# Patient Record
Sex: Male | Born: 1990 | Race: Black or African American | Hispanic: No | Marital: Single | Smoking: Current every day smoker
Health system: Southern US, Community
[De-identification: ages and names within clinical notes are randomized; demographics above are authoritative.]

## PROBLEM LIST (undated history)

## (undated) DIAGNOSIS — Z201 Contact with and (suspected) exposure to tuberculosis: Secondary | ICD-10-CM

## (undated) DIAGNOSIS — R569 Unspecified convulsions: Secondary | ICD-10-CM

## (undated) DIAGNOSIS — E119 Type 2 diabetes mellitus without complications: Secondary | ICD-10-CM

## (undated) HISTORY — PX: HERNIA REPAIR: SHX51

---
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
## 2010-10-24 ENCOUNTER — Emergency Department (HOSPITAL_COMMUNITY): Payer: Self-pay

## 2010-10-24 ENCOUNTER — Emergency Department (HOSPITAL_COMMUNITY)
Admission: EM | Admit: 2010-10-24 | Discharge: 2010-10-25 | Disposition: A | Discharge status: Home / Self Care | Payer: No Typology Code available for payment source | Attending: Emergency Medicine | Admitting: Emergency Medicine

## 2010-10-24 DIAGNOSIS — R6884 Jaw pain: Secondary | ICD-10-CM | POA: Insufficient documentation

## 2010-10-24 DIAGNOSIS — R569 Unspecified convulsions: Secondary | ICD-10-CM | POA: Insufficient documentation

## 2010-10-24 DIAGNOSIS — R32 Unspecified urinary incontinence: Secondary | ICD-10-CM | POA: Insufficient documentation

## 2010-10-24 DIAGNOSIS — R05 Cough: Secondary | ICD-10-CM | POA: Insufficient documentation

## 2010-10-24 DIAGNOSIS — R059 Cough, unspecified: Secondary | ICD-10-CM | POA: Insufficient documentation

## 2010-10-24 DIAGNOSIS — F29 Unspecified psychosis not due to a substance or known physiological condition: Secondary | ICD-10-CM | POA: Insufficient documentation

## 2010-10-24 DIAGNOSIS — M79609 Pain in unspecified limb: Secondary | ICD-10-CM | POA: Insufficient documentation

## 2010-10-24 DIAGNOSIS — M62838 Other muscle spasm: Secondary | ICD-10-CM | POA: Insufficient documentation

## 2010-10-24 DIAGNOSIS — IMO0001 Reserved for inherently not codable concepts without codable children: Secondary | ICD-10-CM | POA: Insufficient documentation

## 2010-10-24 DIAGNOSIS — R112 Nausea with vomiting, unspecified: Secondary | ICD-10-CM | POA: Insufficient documentation

## 2010-10-24 LAB — POCT I-STAT, CHEM 8
Calcium, Ion: 1.13 mmol/L (ref 1.12–1.32)
Creatinine, Ser: 1.3 mg/dL (ref 0.4–1.5)
Glucose, Bld: 89 mg/dL (ref 70–99)
Hemoglobin: 17 g/dL (ref 13.0–17.0)
Sodium: 140 mEq/L (ref 135–145)
TCO2: 29 mmol/L (ref 0–100)

## 2011-04-16 ENCOUNTER — Emergency Department (HOSPITAL_COMMUNITY)
Admission: EM | Admit: 2011-04-16 | Discharge: 2011-04-16 | Disposition: A | Discharge status: Home / Self Care | Payer: Self-pay | Attending: General Surgery | Admitting: General Surgery

## 2011-04-16 ENCOUNTER — Emergency Department (HOSPITAL_COMMUNITY): Payer: Self-pay

## 2011-04-16 DIAGNOSIS — S21109A Unspecified open wound of unspecified front wall of thorax without penetration into thoracic cavity, initial encounter: Secondary | ICD-10-CM | POA: Insufficient documentation

## 2011-04-16 DIAGNOSIS — W3400XA Accidental discharge from unspecified firearms or gun, initial encounter: Secondary | ICD-10-CM | POA: Insufficient documentation

## 2011-04-16 DIAGNOSIS — IMO0002 Reserved for concepts with insufficient information to code with codable children: Secondary | ICD-10-CM | POA: Insufficient documentation

## 2011-04-16 LAB — TYPE AND SCREEN

## 2011-08-13 ENCOUNTER — Ambulatory Visit (INDEPENDENT_AMBULATORY_CARE_PROVIDER_SITE_OTHER): Payer: Self-pay | Admitting: General Surgery

## 2011-08-19 ENCOUNTER — Encounter (INDEPENDENT_AMBULATORY_CARE_PROVIDER_SITE_OTHER): Payer: Self-pay | Admitting: General Surgery

## 2012-06-21 ENCOUNTER — Emergency Department (HOSPITAL_COMMUNITY)
Admission: EM | Admit: 2012-06-21 | Discharge: 2012-06-22 | Disposition: A | Discharge status: Home / Self Care | Attending: Emergency Medicine | Admitting: Emergency Medicine

## 2012-06-21 ENCOUNTER — Other Ambulatory Visit: Payer: Self-pay

## 2012-06-21 ENCOUNTER — Encounter (HOSPITAL_COMMUNITY): Payer: Self-pay | Admitting: Emergency Medicine

## 2012-06-21 DIAGNOSIS — Z79899 Other long term (current) drug therapy: Secondary | ICD-10-CM | POA: Insufficient documentation

## 2012-06-21 DIAGNOSIS — Z201 Contact with and (suspected) exposure to tuberculosis: Secondary | ICD-10-CM | POA: Insufficient documentation

## 2012-06-21 DIAGNOSIS — R000 Tachycardia, unspecified: Secondary | ICD-10-CM | POA: Insufficient documentation

## 2012-06-21 DIAGNOSIS — H9209 Otalgia, unspecified ear: Secondary | ICD-10-CM | POA: Insufficient documentation

## 2012-06-21 DIAGNOSIS — Z8669 Personal history of other diseases of the nervous system and sense organs: Secondary | ICD-10-CM | POA: Insufficient documentation

## 2012-06-21 DIAGNOSIS — R42 Dizziness and giddiness: Secondary | ICD-10-CM

## 2012-06-21 DIAGNOSIS — R51 Headache: Secondary | ICD-10-CM | POA: Insufficient documentation

## 2012-06-21 HISTORY — DX: Unspecified convulsions: R56.9

## 2012-06-21 HISTORY — DX: Contact with and (suspected) exposure to tuberculosis: Z20.1

## 2012-06-21 NOTE — ED Notes (Signed)
Patient complaining of fast heart rate x 2-3 days with dizziness starting today. Also complaining of neck pain.

## 2012-06-21 NOTE — ED Notes (Addendum)
Pt c/o dizziness and tachycardia.  States that he can "feel his heart beating".  He states that it feels "normal" when lying down but when he stands the heart rate does not change but he can feel it "pounding".  Denies feeling this way at this time.  Heart rate has been within normal limits while presenting at the ED.  Also c/o left sided neck pain extending down into his shoulder. Denies any strain.  Nad.

## 2012-06-21 NOTE — ED Notes (Addendum)
Patient did not complain of being dizzy. Patient did state that he felt like his heart was beating harder when standing. NAD. No needs voiced at this time.

## 2012-06-21 NOTE — ED Provider Notes (Signed)
History     CSN: [id number]  Arrival date & time 06/21/12  2246   First MD Initiated Contact with Patient 06/21/12 2333      Chief Complaint  Patient presents with  . Dizziness  . Tachycardia    (Consider location/radiation/quality/duration/timing/severity/associated sxs/prior treatment) HPI Comments: 21 year old male with a history of no significant medical problems presents with 2 or 3 days of feeling lightheaded when he stands up. He states that he has no other complaints including fevers, chills, nausea, vomiting, chest pain, back pain, abdominal pain, swelling, rashes, dysuria, diarrhea. 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  The symptoms are mild, intermittent, worse with standing, associated with a feeling of his heart beating. He denies that his heart is beating fast or irregular but states that he is aware that it is beating. This is not usual for him.  The history is provided by the patient.    Past Medical History  Diagnosis Date  . Seizures   . 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 Hernia repair     History reviewed. No pertinent family history.  History  Substance Use Topics  . Smoking status: Never Smoker   . Smokeless tobacco: Not on file  . Alcohol Use: No      Review of Systems  HENT: Positive for ear pain (left ear pain for greater than one year).   All other systems reviewed and are negative.    Allergies  Review of patient's allergies indicates no known allergies.  Home Medications   Current Outpatient Rx  Name  Route  Sig  Dispense  Refill  . ISONIAZID 300 MG PO TABS   Oral   Take 300 mg by mouth 2 (two) times a week.         Marland Kitchen PYRIDOXINE HCL 50 MG PO TABS   Oral   Take 50 mg by mouth 2 (two) times a week.           BP 126/71  Pulse 70  Temp 97.8  F (36.6 C)  Resp 18  Ht 6' (1.829 m)  Wt 270 lb (122.471 kg)  BMI 36.62 kg/m2  SpO2 99%  Physical Exam  Nursing note and vitals reviewed. Constitutional: He appears well-developed and well-nourished. No distress.  HENT:  Head: Normocephalic and atraumatic.  Mouth/Throat: Oropharynx is clear and moist. No oropharyngeal exudate.       Right tympanic membrane appears normal, left tympanic membrane is opacified, bulging, loss of landmarks. Mucous membranes are moist  Eyes: Conjunctivae normal and EOM are normal. Pupils are equal, round, and reactive to light. Right eye exhibits no discharge. Left eye exhibits no discharge. No scleral icterus.  Neck: Normal range of motion. Neck supple. No JVD present. No thyromegaly present.  Cardiovascular: Normal rate, regular rhythm, normal heart sounds and intact distal pulses.  Exam reveals no gallop and no friction rub.   No murmur heard. Pulmonary/Chest: Effort normal and breath sounds normal. No respiratory distress. He has no wheezes. He has no rales.  Abdominal: Soft. Bowel sounds are normal. He exhibits no distension and no mass. There is no tenderness.  Musculoskeletal: Normal range of motion. He exhibits no edema and no tenderness.  Lymphadenopathy:    He has no cervical adenopathy.  Neurological: He is alert. Coordination normal.  Skin:  Skin is warm and dry. No rash noted. No erythema.  Psychiatric: He has a normal mood and affect. His behavior is normal.    ED Course  Procedures (including critical care time)  Labs Reviewed  POCT I-STAT, CHEM 8 - Abnormal; Notable for the following:    Glucose, Bld 103 (*)     All other components within normal limits  URINALYSIS, ROUTINE W REFLEX MICROSCOPIC   No results found.   1. Orthostatic lightheadedness       MDM  The patient appears medically well, he has normal vital signs other than mild hypertension. On orthostatic testing he does have a slight change with any increase in his  pulse from 65-88 on standing, blood pressure from 130-120 on standing. This would be consistent with a slight dehydration. Will check potassium, renal function, urinalysis for specific gravity. EKG appears normal.  ED ECG REPORT  I personally interpreted this EKG   Date: 06/22/2012   Rate: 67  Rhythm: Normal sinus rhythm with sinus arrhythmia  QRS Axis: normal  Intervals: normal  ST/T Wave abnormalities: normal  Conduction Disutrbances:none  Narrative Interpretation:   Old EKG Reviewed: none available  Basic metabolic panel reviewed, no abnormalities, urinalysis with a slightly elevated specific gravity, no ketones. Patient stable, encouraged to drink plenty of fluids return as needed.      Vida Roller, MD 06/22/12 [phone]

## 2012-06-22 LAB — URINALYSIS, ROUTINE W REFLEX MICROSCOPIC
Bilirubin Urine: NEGATIVE
Glucose, UA: NEGATIVE mg/dL
Hgb urine dipstick: NEGATIVE
Ketones, ur: NEGATIVE mg/dL
Protein, ur: NEGATIVE mg/dL
Urobilinogen, UA: 0.2 mg/dL (ref 0.0–1.0)

## 2012-06-22 LAB — POCT I-STAT, CHEM 8
BUN: 8 mg/dL (ref 6–23)
Creatinine, Ser: 1.3 mg/dL (ref 0.50–1.35)
Glucose, Bld: 103 mg/dL — ABNORMAL HIGH (ref 70–99)
Hemoglobin: 15.3 g/dL (ref 13.0–17.0)
TCO2: 26 mmol/L (ref 0–100)

## 2012-06-22 MED ORDER — AMOXICILLIN 500 MG PO CAPS: 500.0000 mg | ORAL_CAPSULE | Freq: Three times a day (TID) | ORAL | Status: DC

## 2012-06-22 NOTE — ED Notes (Signed)
Resting quietly, states he no longer has the sensations of his heart pounding.

## 2012-07-02 ENCOUNTER — Emergency Department (HOSPITAL_COMMUNITY)
Admission: EM | Admit: 2012-07-02 | Discharge: 2012-07-02 | Disposition: A | Discharge status: Home / Self Care | Attending: Emergency Medicine | Admitting: Emergency Medicine

## 2012-07-02 ENCOUNTER — Encounter (HOSPITAL_COMMUNITY): Payer: Self-pay | Admitting: *Deleted

## 2012-07-02 DIAGNOSIS — S61259A Open bite of unspecified finger without damage to nail, initial encounter: Secondary | ICD-10-CM

## 2012-07-02 DIAGNOSIS — Y9389 Activity, other specified: Secondary | ICD-10-CM | POA: Insufficient documentation

## 2012-07-02 DIAGNOSIS — R51 Headache: Secondary | ICD-10-CM | POA: Insufficient documentation

## 2012-07-02 DIAGNOSIS — R569 Unspecified convulsions: Secondary | ICD-10-CM | POA: Insufficient documentation

## 2012-07-02 DIAGNOSIS — Z201 Contact with and (suspected) exposure to tuberculosis: Secondary | ICD-10-CM | POA: Insufficient documentation

## 2012-07-02 DIAGNOSIS — Y9210 Unspecified residential institution as the place of occurrence of the external cause: Secondary | ICD-10-CM | POA: Insufficient documentation

## 2012-07-02 DIAGNOSIS — W5311XA Bitten by rat, initial encounter: Secondary | ICD-10-CM | POA: Insufficient documentation

## 2012-07-02 DIAGNOSIS — IMO0002 Reserved for concepts with insufficient information to code with codable children: Secondary | ICD-10-CM | POA: Insufficient documentation

## 2012-07-02 MED ORDER — AMOXICILLIN-POT CLAVULANATE 875-125 MG PO TABS: 1.0000 | ORAL_TABLET | Freq: Two times a day (BID) | ORAL | Status: DC

## 2012-07-02 NOTE — ED Notes (Signed)
Pt states was bite by a rat on his left index finger.

## 2012-07-02 NOTE — ED Provider Notes (Signed)
History     CSN: [id number]  Arrival date & time 07/02/12  0047   First MD Initiated Contact with Patient 07/02/12 0147      Chief Complaint  Patient presents with  . Animal Bite    (Consider location/radiation/quality/duration/timing/severity/associated sxs/prior treatment) Patient is a 21 y.o. male presenting with animal bite. The history is provided by the patient and the police.  Animal Bite  The incident occurred just prior to arrival. Associated symptoms include headaches. Pertinent negatives include no chest pain, no abdominal pain, no nausea, no vomiting and no neck pain.  patient is an inmate at the local jail. States that his left index finger was bit by her at around his locker area. This occurred just prior to arrival. The bite was to the left index finger lateral aspect of the slight drawing some blood no deep cuts or laceration or puncture. There is no evidence of any teeth marks or bites on the other side of the finger. Patient states tetanus is up-to-date.  Past Medical History  Diagnosis Date  . Seizures   . 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 Hernia repair     No family history on file.  History  Substance Use Topics  . Smoking status: Never Smoker   . Smokeless tobacco: Not on file  . Alcohol Use: No      Review of Systems  Constitutional: Negative for fever.  HENT: Negative for neck pain.   Eyes: Negative for redness.  Respiratory: Negative for shortness of breath.   Cardiovascular: Negative for chest pain.  Gastrointestinal: Negative for nausea, vomiting and abdominal pain.  Genitourinary: Negative for dysuria.  Musculoskeletal: Negative for back pain.  Skin: Positive for wound. Negative for rash.  Neurological: Positive for headaches.  Hematological: Does not bruise/bleed easily.  Psychiatric/Behavioral: Negative for confusion.    Allergies  Review of patient's allergies indicates no known allergies.  Home  Medications   Current Outpatient Rx  Name  Route  Sig  Dispense  Refill  . AMOXICILLIN 500 MG PO CAPS   Oral   Take 1 capsule (500 mg total) by mouth 3 (three) times daily.   21 capsule   0   . AMOXICILLIN-POT CLAVULANATE 875-125 MG PO TABS   Oral   Take 1 tablet by mouth every 12 (twelve) hours.   14 tablet   0   . ISONIAZID 300 MG PO TABS   Oral   Take 300 mg by mouth 2 (two) times a week.         Marland Kitchen PYRIDOXINE HCL 50 MG PO TABS   Oral   Take 50 mg by mouth 2 (two) times a week.           BP 146/86  Pulse 88  Temp 97.9 F (36.6 C) (Oral)  Resp 18  Ht 5\' 11"  (1.803 m)  Wt 265 lb (120.203 kg)  BMI 36.96 kg/m2  SpO2 97%  Physical Exam  Constitutional: He is oriented to person, place, and time. He appears well-developed and well-nourished.  HENT:  Head: Normocephalic and atraumatic.  Right Ear: External ear normal.  Left Ear: External ear normal.  Mouth/Throat: Oropharynx is clear and moist.       Left TM is slightly erythematous. But not bulging  Eyes: Conjunctivae normal and EOM are normal. Pupils are equal, round, and reactive to light.  Neck: Normal range of motion. Neck supple.  Cardiovascular: Normal rate, regular rhythm and  normal heart sounds.   No murmur heard. Pulmonary/Chest: Effort normal and breath sounds normal. No respiratory distress.  Abdominal: Soft. Bowel sounds are normal. There is no tenderness.  Musculoskeletal: Normal range of motion. He exhibits tenderness.       Extremities are normal except for the left index finger laterally has a very superficial looks like the scrape or laceration does not look teeth marks or bites. No significant swelling good range of motion no significant bleeding.distally cap refill is normal sensation is intact. Radial pulses 2+.  Lymphadenopathy:    He has no cervical adenopathy.  Neurological: He is alert and oriented to person, place, and time. No cranial nerve deficit. He exhibits normal muscle tone.  Coordination normal.  Skin: Skin is warm. No erythema.    ED Course  Procedures (including critical care time)  Labs Reviewed - No data to display No results found.   1. Animal bite of finger       MDM   Patient with a superficial bite to the left index finger. No deep laceration or puncture. The animal was a rat we called poison control for rat rodent bite rabies prophylaxis not recommended. Patient will be treated with Augmentin for prophylaxis from the animal bite.        Shelda Jakes, MD 07/02/12 [phone]

## 2012-07-02 NOTE — ED Notes (Signed)
Phoned poison control and was informed that rats fall in the not significant in transmission category but can cause infection. Patient is to keep it clean and dry and to look for signs of infection. Will inform md of this information

## 2012-08-30 ENCOUNTER — Encounter (HOSPITAL_COMMUNITY): Payer: Self-pay | Admitting: Emergency Medicine

## 2012-08-30 ENCOUNTER — Emergency Department (HOSPITAL_COMMUNITY)
Admission: EM | Admit: 2012-08-30 | Discharge: 2012-08-30 | Attending: Emergency Medicine | Admitting: Emergency Medicine

## 2012-08-30 ENCOUNTER — Emergency Department (HOSPITAL_COMMUNITY)

## 2012-08-30 DIAGNOSIS — G40909 Epilepsy, unspecified, not intractable, without status epilepticus: Secondary | ICD-10-CM | POA: Insufficient documentation

## 2012-08-30 DIAGNOSIS — R110 Nausea: Secondary | ICD-10-CM | POA: Insufficient documentation

## 2012-08-30 DIAGNOSIS — Z79899 Other long term (current) drug therapy: Secondary | ICD-10-CM | POA: Insufficient documentation

## 2012-08-30 DIAGNOSIS — R509 Fever, unspecified: Secondary | ICD-10-CM | POA: Insufficient documentation

## 2012-08-30 DIAGNOSIS — Z9889 Other specified postprocedural states: Secondary | ICD-10-CM | POA: Insufficient documentation

## 2012-08-30 DIAGNOSIS — R1031 Right lower quadrant pain: Secondary | ICD-10-CM | POA: Insufficient documentation

## 2012-08-30 DIAGNOSIS — Z8611 Personal history of tuberculosis: Secondary | ICD-10-CM | POA: Insufficient documentation

## 2012-08-30 DIAGNOSIS — R109 Unspecified abdominal pain: Secondary | ICD-10-CM

## 2012-08-30 LAB — CBC
HCT: 42.1 % (ref 39.0–52.0)
Hemoglobin: 14.1 g/dL (ref 13.0–17.0)
MCH: 27.5 pg (ref 26.0–34.0)
MCHC: 33.5 g/dL (ref 30.0–36.0)
MCV: 82.1 fL (ref 78.0–100.0)
Platelets: 263 10*3/uL (ref 150–400)
RBC: 5.13 MIL/uL (ref 4.22–5.81)
RDW: 13.1 % (ref 11.5–15.5)
WBC: 6.7 10*3/uL (ref 4.0–10.5)

## 2012-08-30 LAB — BASIC METABOLIC PANEL
BUN: 10 mg/dL (ref 6–23)
CO2: 32 mEq/L (ref 19–32)
Calcium: 9.6 mg/dL (ref 8.4–10.5)
Chloride: 96 mEq/L (ref 96–112)
Creatinine, Ser: 1.03 mg/dL (ref 0.50–1.35)
GFR calc Af Amer: >90 mL/min (ref >90)
GFR calc non Af Amer: >90 mL/min (ref >90)
Glucose, Bld: 93 mg/dL (ref 70–99)
Potassium: 3.7 mEq/L (ref 3.5–5.1)
Sodium: 136 mEq/L (ref 135–145)

## 2012-08-30 LAB — URINALYSIS, ROUTINE W REFLEX MICROSCOPIC
Bilirubin Urine: NEGATIVE
Glucose, UA: NEGATIVE mg/dL
Hgb urine dipstick: NEGATIVE
Ketones, ur: NEGATIVE mg/dL
Leukocytes, UA: NEGATIVE
Nitrite: NEGATIVE
Protein, ur: NEGATIVE mg/dL
Specific Gravity, Urine: 1.02 (ref 1.005–1.030)
Urobilinogen, UA: 0.2 mg/dL (ref 0.0–1.0)
pH: 6 (ref 5.0–8.0)

## 2012-08-30 MED ORDER — IOHEXOL 300 MG/ML  SOLN
50.0000 mL | Freq: Once | INTRAMUSCULAR | Status: AC | PRN
  Administered 2012-08-30: 50 mL via ORAL

## 2012-08-30 MED ORDER — SODIUM CHLORIDE 0.9 % IV BOLUS (SEPSIS)
1000.0000 mL | Freq: Once | INTRAVENOUS | Status: AC
  Administered 2012-08-30: 1000 mL via INTRAVENOUS

## 2012-08-30 MED ORDER — KETOROLAC TROMETHAMINE 30 MG/ML IJ SOLN
30.0000 mg | Freq: Once | INTRAMUSCULAR | Status: AC
  Administered 2012-08-30: 30 mg via INTRAVENOUS
  Filled 2012-08-30: qty 1

## 2012-08-30 MED ORDER — IOHEXOL 300 MG/ML  SOLN
100.0000 mL | Freq: Once | INTRAMUSCULAR | Status: AC | PRN
  Administered 2012-08-30: 100 mL via INTRAVENOUS

## 2012-08-30 NOTE — ED Notes (Signed)
Pt c/o rt lower abd pain with nausea x one week. Pt denies any vomiting.

## 2012-08-30 NOTE — ED Notes (Signed)
Patient states he does not need anything at this time. 

## 2012-08-30 NOTE — ED Provider Notes (Signed)
History   This chart was scribed for Raeford Razor, MD by Gerlean Ren, ED Scribe. This patient was seen in room [facility] and the patient's care was started at 7:14 PM    CSN: [id number]  Arrival date & time 08/30/12  4098   First MD Initiated Contact with Patient 08/30/12 1902      Chief Complaint  Patient presents with  . Abdominal Pain  . Nausea     The history is provided by the patient. No language interpreter was used.  Danny Gordon is a 22 y.o. male who presents to the Emergency Department complaining of one week of constant, non-radiating RLQ "pressure" pain that is worsened by changing positions and improved lying down.  Pt reports associated nausea and feeling feverish new today causing him to come in.  Pt denies dysuria, frequency, urgency, hematuria, changes in volume output, dyspnea, flank pain, back pain, emesis, diarrhea, testicular masses or pain.  Pt had abdominal hernia repair 8 months ago and has not noticed any problems with area since.  Pt denies tobacco and alcohol use.   Past Medical History  Diagnosis Date  . Seizures   . 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 Hernia repair     History reviewed. No pertinent family history.  History  Substance Use Topics  . Smoking status: Never Smoker   . Smokeless tobacco: Not on file  . Alcohol Use: No      Review of Systems  Constitutional: Negative for fever.  Respiratory: Negative for shortness of breath.   Gastrointestinal: Positive for nausea and abdominal pain. Negative for vomiting and diarrhea.  Genitourinary: Negative for dysuria, urgency, frequency, hematuria, flank pain, decreased urine volume and testicular pain.  Musculoskeletal: Negative for back pain.  All other systems reviewed and are negative.    Allergies  Review of patient's allergies indicates no known allergies.  Home Medications   Current Outpatient Rx  Name  Route  Sig  Dispense  Refill  .  AMOXICILLIN 500 MG PO CAPS   Oral   Take 1 capsule (500 mg total) by mouth 3 (three) times daily.   21 capsule   0   . AMOXICILLIN-POT CLAVULANATE 875-125 MG PO TABS   Oral   Take 1 tablet by mouth every 12 (twelve) hours.   14 tablet   0   . ISONIAZID 300 MG PO TABS   Oral   Take 300 mg by mouth 2 (two) times a week.         Marland Kitchen PYRIDOXINE HCL 50 MG PO TABS   Oral   Take 50 mg by mouth 2 (two) times a week.           BP 130/77  Pulse 65  Temp 98.6 F (37 C)  Resp 17  Ht 6' (1.829 m)  Wt 270 lb (122.471 kg)  BMI 36.62 kg/m2  SpO2 100%  Physical Exam  Nursing note and vitals reviewed. Constitutional: He appears well-developed and well-nourished. No distress.  HENT:  Head: Normocephalic and atraumatic.  Eyes: Conjunctivae normal are normal. Right eye exhibits no discharge. Left eye exhibits no discharge.  Neck: Neck supple.  Cardiovascular: Normal rate, regular rhythm and normal heart sounds.  Exam reveals no gallop and no friction rub.   No murmur heard. Pulmonary/Chest: Effort normal and breath sounds normal. No respiratory distress.  Abdominal: Soft. He exhibits no distension. There is tenderness.       RLQ tenderness,  no guarding, no rebound  Genitourinary:       Testicular exam normal with no palpable masses.  No CVA tenderness  Musculoskeletal: He exhibits no edema and no tenderness.  Neurological: He is alert.  Skin: Skin is warm and dry.  Psychiatric: He has a normal mood and affect. His behavior is normal. Thought content normal.    ED Course  Procedures (including critical care time) DIAGNOSTIC STUDIES: Oxygen Saturation is 100% on room air, normal by my interpretation.    COORDINATION OF CARE: 7:21 PM- Patient informed of clinical course including abdominal CT scan, understands medical decision-making process, and agrees with plan.   No results found.  Ct Abdomen Pelvis W Contrast  08/30/2012  *RADIOLOGY REPORT*  Clinical Data: Right-sided  abdominal pain for past week.  History prior hernia repair.  CT ABDOMEN AND PELVIS WITH CONTRAST  Technique:  Multidetector CT imaging of the abdomen and pelvis was performed following the standard protocol during bolus administration of intravenous contrast.  Contrast: OMNIPAQUE IOHEXOL 300 MG/ML  SOLN, 50mL OMNIPAQUE IOHEXOL 300 MG/ML  SOLN  Comparison: None.  Findings: Prior anterior abdominal wall hernia repair.  Mild infiltration of fat planes may represent scar tissue.  Inflammation not excluded if of clinical concern.  No extraluminal bowel inflammatory process, free fluid or free air. Specifically, no inflammation surrounds the appendix.  The duodenum with slightly thickened walls diffusely which may be related to under distension.  Duodenitis not excluded in the proper clinical setting.  Liver cyst without focal worrisome liver, splenic, pancreatic, renal or adrenal lesion.  No calcified gallstone.  No abdominal aortic aneurysm.  Reflux into distal esophagus.  No adenopathy.  Mild degenerative changes without bony destructive lesion.  Noncontrast filled views of the urinary bladder unremarkable.  IMPRESSION: Prior anterior abdominal wall hernia repair.  Mild infiltration of fat planes may represent scar tissue.  Inflammation not excluded if of clinical concern.  No extraluminal bowel inflammatory process, free fluid or free air. Specifically, no inflammation surrounds the appendix.  Duodenum with slightly thickened walls diffusely which may be related to under distension.  Duodenitis not excluded in the proper clinical setting.   Original Report Authenticated By: Lacy Duverney, M.D.     1. Abdominal pain   2. Nausea       MDM  22 year old male with right lower quadrant pain. Abdominal exam testicular exam are unremarkable. CT of the abdomen and pelvis is without acute abnormality. A very low suspicion for emergent process. I feel the patient is appropriate for discharge. Emergent return  precautions were discussed   I personally preformed the services scribed in my presence. The recorded information has been reviewed is accurate. Raeford Razor, MD.        Raeford Razor, MD 09/04/12 [phone]

## 2012-12-27 ENCOUNTER — Ambulatory Visit (INDEPENDENT_AMBULATORY_CARE_PROVIDER_SITE_OTHER): Payer: BC Managed Care – PPO | Admitting: Family Medicine

## 2012-12-27 VITALS — BP 130/82 | HR 69 | Temp 98.0°F | Resp 18 | Ht 71.0 in | Wt 293.0 lb

## 2012-12-27 DIAGNOSIS — G4700 Insomnia, unspecified: Secondary | ICD-10-CM

## 2012-12-27 DIAGNOSIS — E669 Obesity, unspecified: Secondary | ICD-10-CM

## 2012-12-27 DIAGNOSIS — R5383 Other fatigue: Secondary | ICD-10-CM

## 2012-12-27 DIAGNOSIS — R21 Rash and other nonspecific skin eruption: Secondary | ICD-10-CM

## 2012-12-27 DIAGNOSIS — R5381 Other malaise: Secondary | ICD-10-CM

## 2012-12-27 DIAGNOSIS — R0982 Postnasal drip: Secondary | ICD-10-CM

## 2012-12-27 DIAGNOSIS — R531 Weakness: Secondary | ICD-10-CM

## 2012-12-27 DIAGNOSIS — J302 Other seasonal allergic rhinitis: Secondary | ICD-10-CM

## 2012-12-27 DIAGNOSIS — L02419 Cutaneous abscess of limb, unspecified: Secondary | ICD-10-CM

## 2012-12-27 LAB — POCT CBC
Granulocyte percent: 61.7 %G (ref 37–80)
Hemoglobin: 13.6 g/dL — AB (ref 14.1–18.1)
MCH, POC: 27.8 pg (ref 27–31.2)
MID (cbc): 0.4 (ref 0–0.9)
MPV: 8.2 fL (ref 0–99.8)
POC Granulocyte: 3.8 (ref 2–6.9)
POC MID %: 7.2 %M (ref 0–12)
Platelet Count, POC: 275 10*3/uL (ref 142–424)
RBC: 4.89 M/uL (ref 4.69–6.13)
WBC: 6.1 10*3/uL (ref 4.6–10.2)

## 2012-12-27 LAB — COMPREHENSIVE METABOLIC PANEL
ALT: 13 U/L (ref 0–53)
AST: 12 U/L (ref 0–37)
Albumin: 4 g/dL (ref 3.5–5.2)
CO2: 27 mEq/L (ref 19–32)
Calcium: 9.7 mg/dL (ref 8.4–10.5)
Chloride: 101 mEq/L (ref 96–112)
Creat: 1.09 mg/dL (ref 0.50–1.35)
Potassium: 4.8 mEq/L (ref 3.5–5.3)
Sodium: 139 mEq/L (ref 135–145)
Total Protein: 7.2 g/dL (ref 6.0–8.3)

## 2012-12-27 LAB — TSH: TSH: 1.463 u[IU]/mL (ref 0.350–4.500)

## 2012-12-27 LAB — LIPID PANEL
Cholesterol: 226 mg/dL — ABNORMAL HIGH (ref 0–200)
Total CHOL/HDL Ratio: 6.5 Ratio

## 2012-12-27 MED ORDER — NYSTATIN-TRIAMCINOLONE 100000-0.1 UNIT/GM-% EX OINT: TOPICAL_OINTMENT | Freq: Three times a day (TID) | CUTANEOUS | Status: DC

## 2012-12-27 MED ORDER — TRAZODONE HCL 50 MG PO TABS: 25.0000 mg | ORAL_TABLET | Freq: Every evening | ORAL | Status: DC | PRN

## 2012-12-27 MED ORDER — FLUTICASONE PROPIONATE 50 MCG/ACT NA SUSP: 2.0000 | Freq: Every day | NASAL | Status: DC

## 2012-12-27 MED ORDER — CHLORHEXIDINE GLUCONATE 4 % EX LIQD: 1.0000 "application " | Freq: Every day | CUTANEOUS | Status: DC | PRN

## 2012-12-27 MED ORDER — CETIRIZINE HCL 10 MG PO TABS: 10.0000 mg | ORAL_TABLET | Freq: Every day | ORAL | Status: DC

## 2012-12-27 MED ORDER — MUPIROCIN 2 % EX OINT: TOPICAL_OINTMENT | Freq: Three times a day (TID) | CUTANEOUS | Status: DC

## 2012-12-27 NOTE — Patient Instructions (Signed)

## 2012-12-27 NOTE — Progress Notes (Signed)
Subjective:    Patient ID: Danny Gordon, male    DOB: 1990/09/13, 22 y.o.   MRN: [id number]  HPI  Also has bumps on fingers present - intermittent for about 1 1/2 yrs now.  On right arm was also intermittent larger bump - x 3d - now starting to shrink again, not draining - occ painful - has had under left axilla as well in the past- these bumps will move around.  Concerned testosterone is running low due to severe fatigue and low energy drive - mother used to a healthcare provider so suggested he get tested.  Fasting.  No ED.  Throat burns intermittently - worse later on in the day. No indigestion or heartburn.  Not really tried anything for it.  Lots of seasonal allergies  Can't sleep - for about 5 yrs - rarely sleeps - has trouble falling asleep and staying asleep. Does not snore.  No PND. No reported apnea.  Around 5 pm just gets really fatigued. No naps.  Neg Epworth sleepiness scale. When he finally feels sleepy he can't fall asleep.  Has a tv in bedroom - watch in bed.  Past Medical History  Diagnosis Date  . Seizures   . Exposure to TB    Current Outpatient Prescriptions on File Prior to Visit  Medication Sig Dispense Refill  . ibuprofen (ADVIL,MOTRIN) 200 MG tablet Take 400 mg by mouth daily as needed. For pain       No current facility-administered medications on file prior to visit.   No Known Allergies    Review of Systems  Constitutional: Positive for fatigue. Negative for fever, chills, activity change and appetite change.  HENT: Positive for congestion, sore throat, rhinorrhea and postnasal drip. Negative for trouble swallowing and voice change.   Respiratory: Negative for apnea and shortness of breath.   Cardiovascular: Negative for leg swelling.  Gastrointestinal: Negative for nausea, vomiting, abdominal pain, diarrhea and constipation.  Genitourinary: Negative for scrotal swelling, penile pain and testicular pain.       No erectile dysfunction   Musculoskeletal: Positive for myalgias. Negative for joint swelling and gait problem.  Skin: Positive for rash. Negative for color change and wound.  Neurological: Negative for weakness and numbness.  Hematological: Negative for adenopathy. Does not bruise/bleed easily.  Psychiatric/Behavioral: Positive for sleep disturbance. The patient is not nervous/anxious.       BP 130/82  Pulse 69  Temp(Src) 98 F (36.7 C) (Oral)  Resp 18  Ht 5\' 11"  (1.803 m)  Wt 293 lb (132.904 kg)  BMI 40.88 kg/m2  SpO2 100% Objective:   Physical Exam  Constitutional: He is oriented to person, place, and time. He appears well-developed and well-nourished. No distress.  HENT:  Head: Normocephalic and atraumatic.  Right Ear: External ear and ear canal normal. Tympanic membrane is injected and retracted. A middle ear effusion is present.  Left Ear: External ear and ear canal normal. Tympanic membrane is retracted. Tympanic membrane is not injected.  No middle ear effusion.  Nose: Mucosal edema and rhinorrhea present. Right sinus exhibits no maxillary sinus tenderness and no frontal sinus tenderness. Left sinus exhibits no maxillary sinus tenderness and no frontal sinus tenderness.  Mouth/Throat: Uvula is midline and mucous membranes are normal. Posterior oropharyngeal erythema present. No oropharyngeal exudate or posterior oropharyngeal edema.  Eyes: Conjunctivae are normal. Right eye exhibits no discharge. Left eye exhibits no discharge. No scleral icterus.  Neck: Normal range of motion. Neck supple. No thyromegaly present.  Cardiovascular: Normal  rate, regular rhythm, normal heart sounds and intact distal pulses.   Pulmonary/Chest: Effort normal and breath sounds normal. No respiratory distress.  Lymphadenopathy:       Head (right side): No submandibular, no preauricular, no posterior auricular and no occipital adenopathy present.       Head (left side): No submandibular, no preauricular and no posterior  auricular adenopathy present.    He has no cervical adenopathy.    He has no axillary adenopathy.       Right: No supraclavicular adenopathy present.       Left: No supraclavicular adenopathy present.  Neurological: He is alert and oriented to person, place, and time.  Skin: Skin is warm and dry. Rash noted. Rash is papular and nodular. He is not diaphoretic. No erythema.  In right axilla is approx 1/2 cm laceration with sanguineous discharge with palpation and tenderness, a lot of surrounding scarring. No erythema, fluctuance, or induration, no purulent discharge. On bilateral palms, on lower phalynges immed distal to MPT joint are skin colored pin point keratinized nodules - 3-4 clustered together on about 1/2 of fingers.  Psychiatric: He has a normal mood and affect. His behavior is normal.      Results for orders placed in visit on 12/27/12  POCT CBC      Result Value Range   WBC 6.1  4.6 - 10.2 K/uL   Lymph, poc 1.9  0.6 - 3.4   POC LYMPH PERCENT 31.1  10 - 50 %L   MID (cbc) 0.4  0 - 0.9   POC MID % 7.2  0 - 12 %M   POC Granulocyte 3.8  2 - 6.9   Granulocyte percent 61.7  37 - 80 %G   RBC 4.89  4.69 - 6.13 M/uL   Hemoglobin 13.6 (*) 14.1 - 18.1 g/dL   HCT, POC 16.1  09.6 - 53.7 %   MCV 91.2  80 - 97 fL   MCH, POC 27.8  27 - 31.2 pg   MCHC 30.5 (*) 31.8 - 35.4 g/dL   RDW, POC 04.5     Platelet Count, POC 275  142 - 424 K/uL   MPV 8.2  0 - 99.8 fL    Assessment & Plan:  Weakness - Plan: POCT CBC, Comprehensive metabolic panel, Testosterone, free, total, TSH, Vitamin D 25 hydroxy  Other malaise and fatigue - Plan: POCT CBC, Comprehensive metabolic panel, Testosterone, free, total, TSH, Vitamin D 25 hydroxy  Obesity, unspecified - Plan: POCT CBC, Comprehensive metabolic panel, Lipid panel, Testosterone, free, total, TSH, Vitamin D 25 hydroxy   Rash and nonspecific skin eruption - Unsure what bumps on his had are - likely benign - will do trial of mycolog but if continue,  consider referral to derm.  Postnasal drip due to Seasonal allergies - likely cause of sore throat - rec zyrtec and flonase every night before bed.  Insomnia - Reviewed sleep hygiene in great detail. No tv in bedroom.  Start trial of trazodone.  Abscess of axilla -  Plan: Wound culture - has already drained, nothing further for I&D but start topical mupirocin to lesion and lots of warm compresses - can do hibiclens/nasal mupirocin combo to prevent further since he has such freq problems.

## 2012-12-28 LAB — VITAMIN D 25 HYDROXY (VIT D DEFICIENCY, FRACTURES): Vit D, 25-Hydroxy: 18 ng/mL — ABNORMAL LOW (ref 30–89)

## 2012-12-28 LAB — TESTOSTERONE, FREE, TOTAL, SHBG
Sex Hormone Binding: 18 nmol/L (ref 13–71)
Testosterone, Free: 73.8 pg/mL (ref 47.0–244.0)
Testosterone: 279 ng/dL — ABNORMAL LOW (ref 300–890)

## 2012-12-29 LAB — WOUND CULTURE: Gram Stain: NONE SEEN

## 2012-12-31 ENCOUNTER — Other Ambulatory Visit: Payer: Self-pay | Admitting: Family Medicine

## 2012-12-31 DIAGNOSIS — E559 Vitamin D deficiency, unspecified: Secondary | ICD-10-CM | POA: Insufficient documentation

## 2013-01-01 MED ORDER — ERGOCALCIFEROL 1.25 MG (50000 UT) PO CAPS: 50000.0000 [IU] | ORAL_CAPSULE | ORAL | Status: DC

## 2013-03-14 ENCOUNTER — Ambulatory Visit: Payer: Self-pay | Admitting: Emergency Medicine

## 2013-03-14 VITALS — BP 126/90 | HR 81 | Temp 98.7°F | Resp 18 | Ht 71.5 in | Wt 303.4 lb

## 2013-03-14 DIAGNOSIS — Z0289 Encounter for other administrative examinations: Secondary | ICD-10-CM

## 2013-03-14 DIAGNOSIS — E291 Testicular hypofunction: Secondary | ICD-10-CM

## 2013-03-14 MED ORDER — TESTOSTERONE 20.25 MG/1.25GM (1.62%) TD GEL: 4.0000 | Freq: Every day | TRANSDERMAL | Status: DC

## 2013-03-14 NOTE — Progress Notes (Signed)
[facility] [address] [place] [address] [phone]  Date:  03/14/2013   Name:  Danny Gordon   DOB:  [birth year]   MRN:  [id number]  PCP:  No primary provider on file.    Chief Complaint: Employment Physical   History of Present Illness:  Danny Gordon is a 22 y.o. very pleasant male patient who presents with the following:  DOT certification.  No symptoms suggestive OSA  Patient Active Problem List   Diagnosis Date Noted  . Unspecified vitamin D deficiency 12/31/2012    Past Medical History  Diagnosis Date  . Seizures   . Exposure to TB     Past Surgical History  Procedure Laterality Date  . Hernia repair      History  Substance Use Topics  . Smoking status: Current Every Day Smoker  . Smokeless tobacco: Not on file  . Alcohol Use: No    Family History  Problem Relation Age of Onset  . Diabetes Father     No Known Allergies  Medication list has been reviewed and updated.  Current Outpatient Prescriptions on File Prior to Visit  Medication Sig Dispense Refill  . ibuprofen (ADVIL,MOTRIN) 200 MG tablet Take 400 mg by mouth daily as needed. For pain      . cetirizine (ZYRTEC) 10 MG tablet Take 1 tablet (10 mg total) by mouth at bedtime.  30 tablet  11  . chlorhexidine (HIBICLENS) 4 % external liquid Apply 15 mLs (1 application total) topically daily as needed.  473 mL  0  . ergocalciferol (VITAMIN D2) 50000 UNITS capsule Take 1 capsule (50,000 Units total) by mouth once a week.  4 capsule  3  . fluticasone (FLONASE) 50 MCG/ACT nasal spray Place 2 sprays into the nose at bedtime.  16 g  6  . mupirocin ointment (BACTROBAN) 2 % Apply topically 3 (three) times daily.  30 g  1  . nystatin-triamcinolone ointment (MYCOLOG) Apply topically 3 (three) times daily.  60 g  1  . traZODone (DESYREL) 50 MG tablet Take 0.5-1 tablets (25-50 mg total) by mouth at bedtime as needed for sleep.  30 tablet  3   No current facility-administered  medications on file prior to visit.    Review of Systems:  As per HPI, otherwise negative.    Physical Examination: Filed Vitals:   03/14/13 1357  BP: 126/90  Pulse: 81  Temp: 98.7 F (37.1 C)  Resp: 18   Filed Vitals:   03/14/13 1357  Height: 5' 11.5" (1.816 m)  Weight: 303 lb 6.4 oz (137.621 kg)   Body mass index is 41.73 kg/(m^2). Ideal Body Weight: Weight in (lb) to have BMI = 25: 181.4  GEN: WDWN, NAD, Non-toxic, A & O x 3 HEENT: Atraumatic, Normocephalic. Neck supple. No masses, No LAD. Ears and Nose: No external deformity. CV: RRR, No M/G/R. No JVD. No thrill. No extra heart sounds. PULM: CTA B, no wheezes, crackles, rhonchi. No retractions. No resp. distress. No accessory muscle use. ABD: S, NT, ND, +BS. No rebound. No HSM. EXTR: No c/c/e NEURO Normal gait.  PSYCH: Normally interactive. Conversant. Not depressed or anxious appearing.  Calm demeanor.    Assessment and Plan: DOT certification 1 month pending sleep study Sleep study   Signed,  Phillips Odor, MD

## 2013-03-14 NOTE — Addendum Note (Signed)
Addended by: Carmelina Dane on: 03/14/2013 03:05 PM   Modules accepted: Orders

## 2013-03-16 ENCOUNTER — Telehealth: Payer: Self-pay

## 2013-03-16 NOTE — Telephone Encounter (Signed)
Pharm sent fax that Androgel is not covered

## 2013-03-17 ENCOUNTER — Encounter: Payer: Self-pay | Admitting: Emergency Medicine

## 2013-03-17 NOTE — Telephone Encounter (Signed)
Pt calling regarding his testosterone medication, he states that it has been about five days and he is still unable to receive it due to his insurance. [phone]

## 2013-03-20 MED ORDER — TESTOSTERONE 50 MG/5GM (1%) TD GEL: 10.0000 g | Freq: Every day | TRANSDERMAL | Status: DC

## 2013-03-20 NOTE — Telephone Encounter (Signed)
androgel not covered. Please advise.

## 2013-03-20 NOTE — Addendum Note (Signed)
Addended by: Carmelina Dane on: 03/20/2013 04:24 PM   Modules accepted: Orders

## 2013-03-29 ENCOUNTER — Telehealth: Payer: Self-pay

## 2013-03-29 DIAGNOSIS — E291 Testicular hypofunction: Secondary | ICD-10-CM

## 2013-03-29 NOTE — Telephone Encounter (Signed)
Pt wants to know if there is another testosterone medication that can be prescribed instead of the androgel. Call at [phone].

## 2013-03-30 NOTE — Telephone Encounter (Signed)
Does patient have something in mind? Is this cost related or application related?

## 2013-03-31 NOTE — Telephone Encounter (Signed)
Androgel is the preferred drug on [employer] web site, also listed is Androderm please advise.

## 2013-03-31 NOTE — Telephone Encounter (Signed)
Called pt to notify him of status. LMOM

## 2013-03-31 NOTE — Telephone Encounter (Signed)
Patient states his insurance will not cover the medication. Wants something else sent in.

## 2013-03-31 NOTE — Telephone Encounter (Signed)
Yes, please let's get a second test. Thanks

## 2013-03-31 NOTE — Telephone Encounter (Signed)
I called ins to check status of the prior auth I had completed on [email or web address] on 03/16/13. Ins advised that they do not have record of form, but that the guidelines for approval for ANY of the testosterone replacements is that the pt has to have had TWO testosterone tests that are confirmed by free testosterone test and one of them has to be in the morning. Dr Dareen Piano, do you want to order a lab only testosterone test for pt so that he will have a second test done in the morning?

## 2013-04-01 NOTE — Addendum Note (Signed)
Addended by: Sheppard Plumber A on: 04/01/2013 11:26 AM   Modules accepted: Orders

## 2013-04-01 NOTE — Telephone Encounter (Signed)
PA requires that pt have 2 testosterone tests. Have informed pt and lab order is in EPIC.

## 2013-04-01 NOTE — Telephone Encounter (Signed)
Put in future order for testosterone lab and advised pt that he needs to come in first thing in the morning fasting. Pt agreed.

## 2013-04-01 NOTE — Telephone Encounter (Signed)
Spoke with pt advised to RTC to get a second test. Pt understood.

## 2013-04-04 ENCOUNTER — Other Ambulatory Visit (INDEPENDENT_AMBULATORY_CARE_PROVIDER_SITE_OTHER): Payer: BC Managed Care – PPO | Admitting: Family Medicine

## 2013-04-04 DIAGNOSIS — E291 Testicular hypofunction: Secondary | ICD-10-CM

## 2013-04-05 ENCOUNTER — Institutional Professional Consult (permissible substitution): Payer: BC Managed Care – PPO | Admitting: Neurology

## 2013-04-05 LAB — TESTOSTERONE: Testosterone: 280 ng/dL — ABNORMAL LOW (ref 300–890)

## 2013-04-06 ENCOUNTER — Institutional Professional Consult (permissible substitution): Payer: BC Managed Care – PPO | Admitting: Neurology

## 2013-04-18 NOTE — Telephone Encounter (Signed)
Pt came in to have 2nd lab done. Called pt to discuss Sxs pt has experienced d/t low testosterone that are listed on ins form. Faxed in completed form w/confirmation. Advised pt that I will let him know when I get decision from ins.

## 2013-04-20 ENCOUNTER — Telehealth: Payer: Self-pay | Admitting: Radiology

## 2013-04-20 NOTE — Telephone Encounter (Signed)
Please call pharmacy [phone] they have questions about the androgel Rx and the prior auth Marsha or Santina Evans

## 2013-04-20 NOTE — Telephone Encounter (Signed)
Received approval of PA of Androgel through 04/20/14. Notified pt and pharmacy.

## 2013-04-20 NOTE — Telephone Encounter (Signed)
Contacted pharmacy and notified them PA for Androgel has been approved. Pharmacist stated that the quantity we sent in will not even last pt 15 days. I gave her OK to fill quantity sufficient for 30 days.

## 2015-03-26 ENCOUNTER — Encounter (HOSPITAL_BASED_OUTPATIENT_CLINIC_OR_DEPARTMENT_OTHER): Payer: Self-pay | Admitting: *Deleted

## 2015-03-26 ENCOUNTER — Emergency Department (HOSPITAL_BASED_OUTPATIENT_CLINIC_OR_DEPARTMENT_OTHER)

## 2015-03-26 ENCOUNTER — Emergency Department (HOSPITAL_BASED_OUTPATIENT_CLINIC_OR_DEPARTMENT_OTHER)
Admission: EM | Admit: 2015-03-26 | Discharge: 2015-03-26 | Disposition: A | Discharge status: Home / Self Care | Attending: Emergency Medicine | Admitting: Emergency Medicine

## 2015-03-26 DIAGNOSIS — Z79899 Other long term (current) drug therapy: Secondary | ICD-10-CM | POA: Insufficient documentation

## 2015-03-26 DIAGNOSIS — K6389 Other specified diseases of intestine: Secondary | ICD-10-CM

## 2015-03-26 DIAGNOSIS — K529 Noninfective gastroenteritis and colitis, unspecified: Secondary | ICD-10-CM

## 2015-03-26 DIAGNOSIS — R1032 Left lower quadrant pain: Secondary | ICD-10-CM

## 2015-03-26 DIAGNOSIS — Z7951 Long term (current) use of inhaled steroids: Secondary | ICD-10-CM | POA: Insufficient documentation

## 2015-03-26 DIAGNOSIS — Z720 Tobacco use: Secondary | ICD-10-CM | POA: Insufficient documentation

## 2015-03-26 DIAGNOSIS — Q438 Other specified congenital malformations of intestine: Secondary | ICD-10-CM | POA: Insufficient documentation

## 2015-03-26 LAB — URINALYSIS, ROUTINE W REFLEX MICROSCOPIC
Bilirubin Urine: NEGATIVE
GLUCOSE, UA: NEGATIVE mg/dL
HGB URINE DIPSTICK: NEGATIVE
KETONES UR: NEGATIVE mg/dL
LEUKOCYTES UA: NEGATIVE
Nitrite: NEGATIVE
PH: 7 (ref 5.0–8.0)
PROTEIN: NEGATIVE mg/dL
Specific Gravity, Urine: 1.024 (ref 1.005–1.030)
Urobilinogen, UA: 0.2 mg/dL (ref 0.0–1.0)

## 2015-03-26 LAB — CBC WITH DIFFERENTIAL/PLATELET
BASOS ABS: 0 10*3/uL (ref 0.0–0.1)
BASOS PCT: 0 % (ref 0–1)
Eosinophils Absolute: 0.3 10*3/uL (ref 0.0–0.7)
Eosinophils Relative: 4 % (ref 0–5)
HEMATOCRIT: 41.5 % (ref 39.0–52.0)
Hemoglobin: 13.7 g/dL (ref 13.0–17.0)
Lymphocytes Relative: 25 % (ref 12–46)
Lymphs Abs: 2.1 10*3/uL (ref 0.7–4.0)
MCH: 27.7 pg (ref 26.0–34.0)
MCHC: 33 g/dL (ref 30.0–36.0)
MCV: 84 fL (ref 78.0–100.0)
MONO ABS: 0.4 10*3/uL (ref 0.1–1.0)
Monocytes Relative: 5 % (ref 3–12)
NEUTROS ABS: 5.5 10*3/uL (ref 1.7–7.7)
Neutrophils Relative %: 66 % (ref 43–77)
PLATELETS: 309 10*3/uL (ref 150–400)
RBC: 4.94 MIL/uL (ref 4.22–5.81)
RDW: 13.9 % (ref 11.5–15.5)
WBC: 8.3 10*3/uL (ref 4.0–10.5)

## 2015-03-26 LAB — COMPREHENSIVE METABOLIC PANEL
ALBUMIN: 3.8 g/dL (ref 3.5–5.0)
ALT: 15 U/L — AB (ref 17–63)
AST: 17 U/L (ref 15–41)
Alkaline Phosphatase: 67 U/L (ref 38–126)
Anion gap: 8 (ref 5–15)
BILIRUBIN TOTAL: 0.7 mg/dL (ref 0.3–1.2)
BUN: 11 mg/dL (ref 6–20)
CHLORIDE: 99 mmol/L — AB (ref 101–111)
CO2: 29 mmol/L (ref 22–32)
Calcium: 8.9 mg/dL (ref 8.9–10.3)
Creatinine, Ser: 1.09 mg/dL (ref 0.61–1.24)
GFR calc Af Amer: >60 mL/min (ref >60)
GFR calc non Af Amer: >60 mL/min (ref >60)
GLUCOSE: 130 mg/dL — AB (ref 65–99)
POTASSIUM: 3.6 mmol/L (ref 3.5–5.1)
Sodium: 136 mmol/L (ref 135–145)
Total Protein: 6.9 g/dL (ref 6.5–8.1)

## 2015-03-26 MED ORDER — IOHEXOL 300 MG/ML  SOLN
100.0000 mL | Freq: Once | INTRAMUSCULAR | Status: AC | PRN
  Administered 2015-03-26: 100 mL via INTRAVENOUS

## 2015-03-26 MED ORDER — IOHEXOL 300 MG/ML  SOLN
25.0000 mL | Freq: Once | INTRAMUSCULAR | Status: AC | PRN
  Administered 2015-03-26: 25 mL via ORAL

## 2015-03-26 MED ORDER — HYDROCODONE-ACETAMINOPHEN 5-325 MG PO TABS: ORAL_TABLET | ORAL | Status: DC

## 2015-03-26 MED ORDER — ONDANSETRON HCL 4 MG/2ML IJ SOLN
4.0000 mg | Freq: Once | INTRAMUSCULAR | Status: AC
  Administered 2015-03-26: 4 mg via INTRAVENOUS
  Filled 2015-03-26: qty 2

## 2015-03-26 NOTE — ED Provider Notes (Signed)
CSN: [id number]     Arrival date & time 03/26/15  1233 History   First MD Initiated Contact with Patient 03/26/15 1254     Chief Complaint  Patient presents with  . Abdominal Pain     (Consider location/radiation/quality/duration/timing/severity/associated sxs/prior Treatment) HPI Comments: Patient with history of ventral hernia repair presents with complaint of left lower abdominal pain ongoing for the past 3 days. Pain has been located at the same spot the entire time and does not radiate. No associated fever, nausea, vomiting. Last bowel movement was prior to arrival and was normal. No blood noted in the stool. No urinary symptoms. Pain is worse with movement and turning. Patient states that if he jumps up and down he would have severe pain and 'it wouldn't happen'. No history of colonoscopy or diverticulitis. No history of kidney stone. No treatments prior to arrival. Symptoms are gradually becoming worse since onset. No treatments prior to arrival. Patient does have a history of hormone use but states that the testosterone that he was using has been recalled.  Patient is a 24 y.o. male presenting with abdominal pain. The history is provided by the patient and medical records.  Abdominal Pain Associated symptoms: no chest pain, no cough, no diarrhea, no dysuria, no fever, no nausea, no sore throat and no vomiting     Past Medical History  Diagnosis Date  . Seizures   . 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 Hernia repair     Family History  Problem Relation Age of Onset  . Diabetes Father    Social History  Substance Use Topics  . Smoking status: Current Every Day Smoker -- 0.25 packs/day    Types: Cigarettes  . Smokeless tobacco: None  . Alcohol Use: No    Review of Systems  Constitutional: Negative for fever.  HENT: Negative for rhinorrhea and sore throat.   Eyes: Negative for redness.  Respiratory: Negative for cough.   Cardiovascular:  Negative for chest pain.  Gastrointestinal: Positive for abdominal pain. Negative for nausea, vomiting, diarrhea and blood in stool.  Genitourinary: Negative for dysuria.  Musculoskeletal: Negative for myalgias.  Skin: Negative for rash.  Neurological: Negative for headaches.      Allergies  Review of patient's allergies indicates no known allergies.  Home Medications   Prior to Admission medications   Medication Sig Start Date End Date Taking? Authorizing Provider  cetirizine (ZYRTEC) 10 MG tablet Take 1 tablet (10 mg total) by mouth at bedtime. 12/27/12   Sherren Mocha, MD  chlorhexidine (HIBICLENS) 4 % external liquid Apply 15 mLs (1 application total) topically daily as needed. 12/27/12   Sherren Mocha, MD  ergocalciferol (VITAMIN D2) 50000 UNITS capsule Take 1 capsule (50,000 Units total) by mouth once a week. 12/31/12   Sherren Mocha, MD  fluticasone (FLONASE) 50 MCG/ACT nasal spray Place 2 sprays into the nose at bedtime. 12/27/12   Sherren Mocha, MD  ibuprofen (ADVIL,MOTRIN) 200 MG tablet Take 400 mg by mouth daily as needed. For pain    Historical Provider, MD  mupirocin ointment (BACTROBAN) 2 % Apply topically 3 (three) times daily. 12/27/12   Sherren Mocha, MD  nystatin-triamcinolone ointment [facility]) Apply topically 3 (three) times daily. 12/27/12   Sherren Mocha, MD  Testosterone (ANDROGEL) 20.25 MG/1.25GM (1.62%) GEL Place 4 Squirts onto the skin daily. 03/14/13   Carmelina Dane, MD  testosterone (ANDROGEL) 50 MG/5GM GEL Place 10 g  onto the skin daily. 03/20/13   Carmelina Dane, MD   BP 150/75 mmHg  Pulse 96  Temp(Src) 98 F (36.7 C) (Oral)  Resp 18  Ht 6' (1.829 m)  Wt 330 lb (149.687 kg)  BMI 44.75 kg/m2  SpO2 98%   Physical Exam  Constitutional: He appears well-developed and well-nourished.  HENT:  Head: Normocephalic and atraumatic.  Eyes: Conjunctivae are normal. Right eye exhibits no discharge. Left eye exhibits no discharge.  Neck: Normal range of motion. Neck supple.    Cardiovascular: Normal rate, regular rhythm and normal heart sounds.   Pulmonary/Chest: Effort normal and breath sounds normal.  Abdominal: Soft. There is tenderness (mild to moderate) in the left lower quadrant. There is no rigidity, no rebound, no guarding, no tenderness at McBurney's point and negative Murphy's sign.    Neurological: He is alert.  Skin: Skin is warm and dry.  Psychiatric: He has a normal mood and affect.  Nursing note and vitals reviewed.   ED Course  Procedures (including critical care time) Labs Review Labs Reviewed  COMPREHENSIVE METABOLIC PANEL - Abnormal; Notable for the following:    Chloride 99 (*)    Glucose, Bld 130 (*)    ALT 15 (*)    All other components within normal limits  CBC WITH DIFFERENTIAL/PLATELET  URINALYSIS, ROUTINE W REFLEX MICROSCOPIC (NOT AT [facility])    Imaging Review Ct Abdomen Pelvis Wo Contrast  03/26/2015   CLINICAL DATA:  Left lower quadrant pain for 3 days with nausea. Patient vomited immediately after the IV contrast injection. Injection was not repeated. Patient was scanned as a noncontrast study.  EXAM: CT ABDOMEN AND PELVIS WITHOUT CONTRAST  TECHNIQUE: Multidetector CT imaging of the abdomen and pelvis was performed following the standard protocol without IV contrast.  COMPARISON:  08/30/2012  FINDINGS: Lower chest: Clear lung bases. Normal heart size. No pericardial or pleural effusion. No significant hiatal hernia.  Abdomen: liver, gallbladder, biliary system, pancreas, spleen, adrenal glands, kidneys are within normal limits for age and demonstrate no acute process by noncontrast imaging. Excreted contrast within the collecting systems noted from the attempted injection.  Negative for bowel obstruction, dilatation, ileus, or free air.  No abdominal free fluid, fluid collection, hemorrhage, abscess, or adenopathy.  Normal aorta.  Negative for aneurysm.  Anterior abdominal wall demonstrates scattered small abdominal wall ventral  hernias with protrusion of fat. The small hernias are all above the umbilicus.  In the left lower quadrant, there is subtle pericolonic stranding in the adjacent colonic fat compatible with mild acute epiploic appendagitis, image 63. Adjacent colon appears unremarkable without wall thickening or diverticular disease. Colon is collapsed.  Normal appendix demonstrated.  Pelvis: Excreted contrast in the left distal ureter and bladder. No pelvic free fluid, fluid collection, hemorrhage, abscess, adenopathy, inguinal abnormality, or hernia.  No acute osseous finding.  IMPRESSION: Mild acute left lower quadrant epiploic appendagitis along the colon.  Scattered small fat containing midline ventral hernias  No associated bowel obstruction   Electronically Signed   By: Judie Petit.  Shick M.D.   On: 03/26/2015 15:28   I have personally reviewed and evaluated these images and lab results as part of my medical decision-making.   EKG Interpretation None      1:11 PM Patient seen and examined. Work-up initiated. Given worsen pain with movement -- will check abd CT to eval for diverticulitis/colitis.    Vital signs reviewed and are as follows: BP 150/75 mmHg  Pulse 96  Temp(Src) 98 F (36.7  C) (Oral)  Resp 18  Ht 6' (1.829 m)  Wt 330 lb (149.687 kg)  BMI 44.75 kg/m2  SpO2 98%   2:47 PM Patient vomited in CT.   3:52 PM CT findings consistent with epiploic appendagitis. Patient informed. Discussed that this is a self-limiting condition.  The patient was urged to return to the Emergency Department immediately with worsening of current symptoms, worsening abdominal pain, persistent vomiting, blood noted in stools, fever, or any other concerns. The patient verbalized understanding.   Patient counseled on use of narcotic pain medications. Counseled not to combine these medications with others containing tylenol. Urged not to drink alcohol, drive, or perform any other activities that requires focus while taking these  medications. The patient verbalizes understanding and agrees with the plan.    MDM   Final diagnoses:  Epiploic appendagitis   Patient with focal left lower quadrant/flank pain. CT is consistent with epiploic appendigitis which is consistent with the patient's exam. Labs otherwise unremarkable. Patient vomited once in the CT scanner, however is no longer nauseous. Patient appears well, nontoxic. Pain medication given with PCP referrals. Return instructions as above.    Renne Crigler, PA-C 03/26/15 1555  Geoffery Lyons, MD 03/27/15 1415

## 2015-03-26 NOTE — ED Notes (Signed)
Pt. Reports his nausea is better.

## 2015-03-26 NOTE — ED Notes (Signed)
Pt. Vomited in CT scanner.  PA notified and zofran was given.  RN over to assess pt. And give meds.

## 2015-03-26 NOTE — Discharge Instructions (Signed)
Please read and follow all provided instructions.  Your diagnoses today include:  1. Epiploic appendagitis   2. LLQ pain     Tests performed today include:  Blood counts and electrolytes  Blood tests to check liver and kidney function  Urine test to look for infection  CT scan - shows epiploic appendagitis  Vital signs. See below for your results today.   Medications prescribed:   Vicodin (hydrocodone/acetaminophen) - narcotic pain medication  DO NOT drive or perform any activities that require you to be awake and alert because this medicine can make you drowsy. BE VERY CAREFUL not to take multiple medicines containing Tylenol (also called acetaminophen). Doing so can lead to an overdose which can damage your liver and cause liver failure and possibly death.  Take any prescribed medications only as directed.  Home care instructions:   Follow any educational materials contained in this packet.  Follow-up instructions: Please follow-up with your primary care provider in the next 32 days for further evaluation of your symptoms if not improved.    Return instructions:  SEEK IMMEDIATE MEDICAL ATTENTION IF:  The pain does not go away or becomes severe   A temperature above 101F develops   Repeated vomiting occurs (multiple episodes)   The pain becomes localized to portions of the abdomen. The right side could possibly be appendicitis. In an adult, the left lower portion of the abdomen could be colitis or diverticulitis.   Blood is being passed in stools or vomit (bright red or black tarry stools)   You develop chest pain, difficulty breathing, dizziness or fainting, or become confused, poorly responsive, or inconsolable (young children)  If you have any other emergent concerns regarding your health  Additional Information: Abdominal (belly) pain can be caused by many things. Your caregiver performed an examination and possibly ordered blood/urine tests and imaging (CT  scan, x-rays, ultrasound). Many cases can be observed and treated at home after initial evaluation in the emergency department. Even though you are being discharged home, abdominal pain can be unpredictable. Therefore, you need a repeated exam if your pain does not resolve, returns, or worsens. Most patients with abdominal pain don't have to be admitted to the hospital or have surgery, but serious problems like appendicitis and gallbladder attacks can start out as nonspecific pain. Many abdominal conditions cannot be diagnosed in one visit, so follow-up evaluations are very important.  Your vital signs today were: BP 132/77 mmHg   Pulse 91   Temp(Src) 98 F (36.7 C) (Oral)   Resp 18   Ht 6' (1.829 m)   Wt 330 lb (149.687 kg)   BMI 44.75 kg/m2   SpO2 97% If your blood pressure (bp) was elevated above 135/85 this visit, please have this repeated by your doctor within one month. --------------

## 2015-03-26 NOTE — ED Notes (Signed)
Abdominal pain in his lower left quadrant x 3 days.

## 2015-06-06 ENCOUNTER — Emergency Department (HOSPITAL_BASED_OUTPATIENT_CLINIC_OR_DEPARTMENT_OTHER)
Admission: EM | Admit: 2015-06-06 | Discharge: 2015-06-06 | Disposition: A | Discharge status: Home / Self Care | Attending: Emergency Medicine | Admitting: Emergency Medicine

## 2015-06-06 ENCOUNTER — Encounter (HOSPITAL_BASED_OUTPATIENT_CLINIC_OR_DEPARTMENT_OTHER): Payer: Self-pay

## 2015-06-06 ENCOUNTER — Emergency Department (HOSPITAL_BASED_OUTPATIENT_CLINIC_OR_DEPARTMENT_OTHER)

## 2015-06-06 DIAGNOSIS — R1010 Upper abdominal pain, unspecified: Secondary | ICD-10-CM

## 2015-06-06 DIAGNOSIS — Z7951 Long term (current) use of inhaled steroids: Secondary | ICD-10-CM | POA: Insufficient documentation

## 2015-06-06 DIAGNOSIS — Z720 Tobacco use: Secondary | ICD-10-CM | POA: Insufficient documentation

## 2015-06-06 DIAGNOSIS — K439 Ventral hernia without obstruction or gangrene: Secondary | ICD-10-CM | POA: Insufficient documentation

## 2015-06-06 DIAGNOSIS — Z792 Long term (current) use of antibiotics: Secondary | ICD-10-CM | POA: Insufficient documentation

## 2015-06-06 DIAGNOSIS — Z79899 Other long term (current) drug therapy: Secondary | ICD-10-CM | POA: Insufficient documentation

## 2015-06-06 LAB — CBC WITH DIFFERENTIAL/PLATELET
Basophils Absolute: 0.1 10*3/uL (ref 0.0–0.1)
Basophils Relative: 1 %
Eosinophils Absolute: 0.4 10*3/uL (ref 0.0–0.7)
Eosinophils Relative: 5 %
HCT: 42.4 % (ref 39.0–52.0)
HEMOGLOBIN: 14.1 g/dL (ref 13.0–17.0)
LYMPHS ABS: 3.3 10*3/uL (ref 0.7–4.0)
LYMPHS PCT: 40 %
MCH: 28 pg (ref 26.0–34.0)
MCHC: 33.3 g/dL (ref 30.0–36.0)
MCV: 84.1 fL (ref 78.0–100.0)
MONO ABS: 0.6 10*3/uL (ref 0.1–1.0)
MONOS PCT: 7 %
NEUTROS ABS: 3.9 10*3/uL (ref 1.7–7.7)
NEUTROS PCT: 47 %
PLATELETS: 358 10*3/uL (ref 150–400)
RBC: 5.04 MIL/uL (ref 4.22–5.81)
RDW: 14.1 % (ref 11.5–15.5)
WBC: 8.3 10*3/uL (ref 4.0–10.5)

## 2015-06-06 LAB — COMPREHENSIVE METABOLIC PANEL
ALK PHOS: 60 U/L (ref 38–126)
ALT: 16 U/L — AB (ref 17–63)
AST: 16 U/L (ref 15–41)
Albumin: 4.1 g/dL (ref 3.5–5.0)
Anion gap: 6 (ref 5–15)
BUN: 12 mg/dL (ref 6–20)
CALCIUM: 9 mg/dL (ref 8.9–10.3)
CHLORIDE: 103 mmol/L (ref 101–111)
CO2: 28 mmol/L (ref 22–32)
Creatinine, Ser: 0.94 mg/dL (ref 0.61–1.24)
Glucose, Bld: 88 mg/dL (ref 65–99)
Potassium: 3.6 mmol/L (ref 3.5–5.1)
Sodium: 137 mmol/L (ref 135–145)
Total Bilirubin: 0.3 mg/dL (ref 0.3–1.2)
Total Protein: 7.2 g/dL (ref 6.5–8.1)

## 2015-06-06 LAB — LIPASE, BLOOD: LIPASE: 38 U/L (ref 11–51)

## 2015-06-06 NOTE — Discharge Instructions (Signed)
Hernia  A hernia happens when an organ or tissue inside your body pushes out through a weak spot in the belly (abdomen).  HOME CARE  · Avoid stretching or overusing (straining) the muscles near the hernia.  · Do not lift anything heavier than 10 lb (4.5 kg).  · Use the muscles in your leg when you lift something up. Do not use the muscles in your back.  · When you cough, try to cough gently.  · Eat a diet that has a lot of fiber. Eat lots of fruits and vegetables.  · Drink enough fluids to keep your pee (urine) clear or pale yellow. Try to drink 6-8 glasses of water a day.  · Take medicines to make your poop soft (stool softeners) as told by your doctor.  · Lose weight, if you are overweight.  · Do not use any tobacco products, including cigarettes, chewing tobacco, or electronic cigarettes. If you need help quitting, ask your doctor.  · Keep all follow-up visits as told by your doctor. This is important.  GET HELP IF:  · The skin by the hernia gets puffy (swollen) or red.  · The hernia is painful.  GET HELP RIGHT AWAY IF:  · You have a fever.  · You have belly pain that is getting worse.  · You feel sick to your stomach (nauseous) or you throw up (vomit).  · You cannot push the hernia back in place by gently pressing on it while you are lying down.  · The hernia:    Changes in shape or size.    Is stuck outside your belly.    Changes color.    Feels hard or tender.     This information is not intended to replace advice given to you by your health care provider. Make sure you discuss any questions you have with your health care provider.     Document Released: 12/31/2009 Document Revised: 08/03/2014 Document Reviewed: 05/23/2014  Elsevier Interactive Patient Education ©2016 [employer]

## 2015-06-06 NOTE — ED Provider Notes (Signed)
CSN: [id number]     Arrival date & time 06/06/15  1845 History   First MD Initiated Contact with Patient 06/06/15 1855     Chief Complaint  Patient presents with  . Abdominal Pain     (Consider location/radiation/quality/duration/timing/severity/associated sxs/prior Treatment) HPI Comments: Pt comes in with c/o right upper abdominal pain that has been going on for 2 year and worsened in the last 2 weeks. Denies fever, vomiting or diarrhea. He does have nausea at times. Certain positions seem worse. He has had surgery on ventral hernia previously.he states that the pain is constant at this time.  The history is provided by the patient. No language interpreter was used.    Past Medical History  Diagnosis Date  . Seizures (HCC)   . 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 Hernia repair     Family History  Problem Relation Age of Onset  . Diabetes Father    Social History  Substance Use Topics  . Smoking status: Current Every Day Smoker -- 0.50 packs/day    Types: Cigarettes  . Smokeless tobacco: None  . Alcohol Use: Yes    Review of Systems  All other systems reviewed and are negative.     Allergies  Review of patient's allergies indicates no known allergies.  Home Medications   Prior to Admission medications   Medication Sig Start Date End Date Taking? Authorizing Provider  cetirizine (ZYRTEC) 10 MG tablet Take 1 tablet (10 mg total) by mouth at bedtime. 12/27/12   Sherren MochaEva N Shaw, MD  chlorhexidine (HIBICLENS) 4 % external liquid Apply 15 mLs (1 application total) topically daily as needed. 12/27/12   Sherren MochaEva N Shaw, MD  ergocalciferol (VITAMIN D2) 50000 UNITS capsule Take 1 capsule (50,000 Units total) by mouth once a week. 12/31/12   Sherren MochaEva N Shaw, MD  fluticasone (FLONASE) 50 MCG/ACT nasal spray Place 2 sprays into the nose at bedtime. 12/27/12   Sherren MochaEva N Shaw, MD  HYDROcodone-acetaminophen (NORCO/VICODIN) 5-325 MG per tablet Take 1-2 tablets every 6  hours as needed for severe pain 03/26/15   Renne CriglerJoshua Geiple, PA-C  ibuprofen (ADVIL,MOTRIN) 200 MG tablet Take 400 mg by mouth daily as needed. For pain    Historical Provider, MD  mupirocin ointment (BACTROBAN) 2 % Apply topically 3 (three) times daily. 12/27/12   Sherren MochaEva N Shaw, MD  nystatin-triamcinolone ointment [facility](MYCOLOG) Apply topically 3 (three) times daily. 12/27/12   Sherren MochaEva N Shaw, MD  Testosterone (ANDROGEL) 20.25 MG/1.25GM (1.62%) GEL Place 4 Squirts onto the skin daily. 03/14/13   [name], MD  testosterone (ANDROGEL) 50 MG/5GM GEL Place 10 g onto the skin daily. 03/20/13   [name], MD   BP 149/98 mmHg  Pulse 71  Temp(Src) 98 F (36.7 C) (Oral)  Resp 16  Ht 6' (1.829 m)  Wt 320 lb (145.151 kg)  BMI 43.39 kg/m2  SpO2 100% Physical Exam  Constitutional: He is oriented to person, place, and time. He appears well-developed and well-nourished.  Cardiovascular: Normal rate and regular rhythm.   Pulmonary/Chest: Effort normal and breath sounds normal.  Abdominal: Soft. Bowel sounds are normal. There is no tenderness.  Musculoskeletal: Normal range of motion.  Neurological: He is alert and oriented to person, place, and time. He exhibits normal muscle tone. Coordination normal.  Skin: Skin is warm and dry.  Psychiatric: He has a normal mood and affect.  Nursing note and vitals reviewed.   ED Course  Procedures (  including critical care time) Labs Review Labs Reviewed  COMPREHENSIVE METABOLIC PANEL - Abnormal; Notable for the following:    ALT 16 (*)    All other components within normal limits  CBC WITH DIFFERENTIAL/PLATELET  LIPASE, BLOOD    Imaging Review Ct Abdomen Pelvis Wo Contrast  06/06/2015  CLINICAL DATA:  Upper abdomen pain starting 2 weeks ago. EXAM: CT ABDOMEN AND PELVIS WITHOUT CONTRAST TECHNIQUE: Multidetector CT imaging of the abdomen and pelvis was performed following the standard protocol without IV contrast. COMPARISON:  March 26, 2015 FINDINGS: The  liver, spleen, pancreas, gallbladder, adrenal glands and kidneys are normal. There is no nephrolithiasis or hydroureteronephrosis bilaterally. The aorta is normal. There is no abdominal lymphadenopathy. There is no small bowel obstruction or diverticulitis. The appendix is normal. Anterior abdominal wall demonstrates scatter small abdominal wall ventral hernias with protrusion of fat unchanged compared to prior CT. The small hernias are all above the umbilicus. The bladder is normal. There is no pelvic lymphadenopathy. The lung bases are clear. No acute abnormalities identified within the bones. IMPRESSION: No acute abnormality identified in the abdomen and pelvis. Anterior abdominal wall with scattered small abdominal wall ventral hernias with protrusions of fat unchanged compared to prior CT. Electronically Signed   By: Sherian Rein M.D.   On: 06/06/2015 21:26   I have personally reviewed and evaluated these images and lab results as part of my medical decision-making.   EKG Interpretation None      MDM   Final diagnoses:  Ventral hernia, recurrence not specified    Pt given follow up for elective surgery for hernia. Pt in agreement with plan    Teressa Lower, NP 06/06/15 1610  Benjiman Core, MD 06/06/15 2253

## 2015-06-06 NOTE — ED Notes (Signed)
Pt reports upper abd pain that started approx 2 weeks ago.  Reports feels like a hernia.  Reports hx of hernia repair.  Reports when he coughs or sneezes he almost throws up and when he tries to roll over in bed its sharp pain.  Reports persistent pain

## 2015-06-10 ENCOUNTER — Telehealth (HOSPITAL_BASED_OUTPATIENT_CLINIC_OR_DEPARTMENT_OTHER): Payer: Self-pay | Admitting: Nurse Practitioner

## 2015-06-10 NOTE — ED Notes (Signed)
tcf patient stating that [facility] needed referal from ed prior to seeing patient. Instructed patient that we do not fax over referrals and that he would need to follow up with [facility]. Patient was told by [facility] that he had to have paper referral. I called and spoke with Panamajessica who is the scheduler and explained that patient was seen in er and needed to follow up with [facility] for hernia. Informed Iantha Fallenkenneth that he would need to call and speak with Shanda Bumpsjessica to make an appointment. No further needs at this time. [facility] number provided for patient to make appointment

## 2015-10-10 ENCOUNTER — Encounter (HOSPITAL_BASED_OUTPATIENT_CLINIC_OR_DEPARTMENT_OTHER): Payer: Self-pay

## 2015-10-10 DIAGNOSIS — R197 Diarrhea, unspecified: Secondary | ICD-10-CM | POA: Insufficient documentation

## 2015-10-10 DIAGNOSIS — R112 Nausea with vomiting, unspecified: Secondary | ICD-10-CM | POA: Insufficient documentation

## 2015-10-10 DIAGNOSIS — F17210 Nicotine dependence, cigarettes, uncomplicated: Secondary | ICD-10-CM | POA: Insufficient documentation

## 2015-10-10 DIAGNOSIS — R1031 Right lower quadrant pain: Secondary | ICD-10-CM | POA: Insufficient documentation

## 2015-10-10 DIAGNOSIS — Z79899 Other long term (current) drug therapy: Secondary | ICD-10-CM | POA: Insufficient documentation

## 2015-10-10 DIAGNOSIS — Z792 Long term (current) use of antibiotics: Secondary | ICD-10-CM | POA: Insufficient documentation

## 2015-10-10 DIAGNOSIS — Z7952 Long term (current) use of systemic steroids: Secondary | ICD-10-CM | POA: Insufficient documentation

## 2015-10-10 DIAGNOSIS — Z7951 Long term (current) use of inhaled steroids: Secondary | ICD-10-CM | POA: Insufficient documentation

## 2015-10-10 LAB — URINALYSIS, ROUTINE W REFLEX MICROSCOPIC
BILIRUBIN URINE: NEGATIVE
GLUCOSE, UA: NEGATIVE mg/dL
HGB URINE DIPSTICK: NEGATIVE
KETONES UR: NEGATIVE mg/dL
Leukocytes, UA: NEGATIVE
Nitrite: NEGATIVE
PH: 6 (ref 5.0–8.0)
PROTEIN: NEGATIVE mg/dL
Specific Gravity, Urine: 1.03 (ref 1.005–1.030)

## 2015-10-10 LAB — CBC WITH DIFFERENTIAL/PLATELET
Basophils Absolute: 0 10*3/uL (ref 0.0–0.1)
Basophils Relative: 0 %
EOS PCT: 3 %
Eosinophils Absolute: 0.2 10*3/uL (ref 0.0–0.7)
HEMATOCRIT: 45 % (ref 39.0–52.0)
Hemoglobin: 14.8 g/dL (ref 13.0–17.0)
Lymphocytes Relative: 17 %
Lymphs Abs: 1.3 10*3/uL (ref 0.7–4.0)
MCH: 27.6 pg (ref 26.0–34.0)
MCHC: 32.9 g/dL (ref 30.0–36.0)
MCV: 84 fL (ref 78.0–100.0)
MONO ABS: 0.4 10*3/uL (ref 0.1–1.0)
MONOS PCT: 5 %
NEUTROS ABS: 6.1 10*3/uL (ref 1.7–7.7)
Neutrophils Relative %: 75 %
PLATELETS: 344 10*3/uL (ref 150–400)
RBC: 5.36 MIL/uL (ref 4.22–5.81)
RDW: 14.1 % (ref 11.5–15.5)
WBC: 8.1 10*3/uL (ref 4.0–10.5)

## 2015-10-10 LAB — COMPREHENSIVE METABOLIC PANEL
ALBUMIN: 4.1 g/dL (ref 3.5–5.0)
ALT: 19 U/L (ref 17–63)
ANION GAP: 12 (ref 5–15)
AST: 15 U/L (ref 15–41)
Alkaline Phosphatase: 75 U/L (ref 38–126)
BILIRUBIN TOTAL: 0.6 mg/dL (ref 0.3–1.2)
BUN: 13 mg/dL (ref 6–20)
CHLORIDE: 98 mmol/L — AB (ref 101–111)
CO2: 26 mmol/L (ref 22–32)
Calcium: 8.9 mg/dL (ref 8.9–10.3)
Creatinine, Ser: 1.02 mg/dL (ref 0.61–1.24)
GFR calc Af Amer: >60 mL/min (ref >60)
GFR calc non Af Amer: >60 mL/min (ref >60)
GLUCOSE: 104 mg/dL — AB (ref 65–99)
POTASSIUM: 3.8 mmol/L (ref 3.5–5.1)
Sodium: 136 mmol/L (ref 135–145)
TOTAL PROTEIN: 8 g/dL (ref 6.5–8.1)

## 2015-10-10 NOTE — ED Notes (Signed)
C/o RLQ pain x 3 days-n/v/d-tylenol last dose 20-30 min PTA

## 2015-10-11 ENCOUNTER — Encounter (HOSPITAL_BASED_OUTPATIENT_CLINIC_OR_DEPARTMENT_OTHER): Payer: Self-pay | Admitting: Emergency Medicine

## 2015-10-11 ENCOUNTER — Emergency Department (HOSPITAL_BASED_OUTPATIENT_CLINIC_OR_DEPARTMENT_OTHER)
Admission: EM | Admit: 2015-10-11 | Discharge: 2015-10-11 | Disposition: A | Discharge status: Home / Self Care | Payer: Self-pay | Attending: Emergency Medicine | Admitting: Emergency Medicine

## 2015-10-11 ENCOUNTER — Emergency Department (HOSPITAL_BASED_OUTPATIENT_CLINIC_OR_DEPARTMENT_OTHER): Payer: Self-pay

## 2015-10-11 DIAGNOSIS — R112 Nausea with vomiting, unspecified: Secondary | ICD-10-CM

## 2015-10-11 DIAGNOSIS — R197 Diarrhea, unspecified: Secondary | ICD-10-CM

## 2015-10-11 DIAGNOSIS — R1031 Right lower quadrant pain: Secondary | ICD-10-CM

## 2015-10-11 MED ORDER — ONDANSETRON HCL 4 MG/2ML IJ SOLN
4.0000 mg | Freq: Once | INTRAMUSCULAR | Status: AC
  Administered 2015-10-11: 4 mg via INTRAVENOUS
  Filled 2015-10-11: qty 2

## 2015-10-11 MED ORDER — SODIUM CHLORIDE 0.9 % IV BOLUS (SEPSIS)
1000.0000 mL | Freq: Once | INTRAVENOUS | Status: AC
  Administered 2015-10-11: 1000 mL via INTRAVENOUS

## 2015-10-11 MED ORDER — IOHEXOL 300 MG/ML  SOLN
25.0000 mL | Freq: Once | INTRAMUSCULAR | Status: AC | PRN
  Administered 2015-10-11: 25 mL via ORAL

## 2015-10-11 MED ORDER — ONDANSETRON 8 MG PO TBDP: ORAL_TABLET | ORAL | Status: DC

## 2015-10-11 MED ORDER — DICYCLOMINE HCL 10 MG/ML IM SOLN
20.0000 mg | Freq: Once | INTRAMUSCULAR | Status: AC
  Administered 2015-10-11: 20 mg via INTRAMUSCULAR
  Filled 2015-10-11: qty 2

## 2015-10-11 MED ORDER — HYOSCYAMINE SULFATE 0.125 MG SL SUBL: 0.1250 mg | SUBLINGUAL_TABLET | SUBLINGUAL | Status: DC | PRN

## 2015-10-11 NOTE — ED Notes (Signed)
Pt given d/c instructions as per chart. Verbalizes understanding. No questions. 

## 2015-10-11 NOTE — ED Provider Notes (Signed)
CSN: [id number]     Arrival date & time 10/10/15  2211 History   First MD Initiated Contact with Patient 10/11/15 0130     Chief Complaint  Patient presents with  . Abdominal Pain     (Consider location/radiation/quality/duration/timing/severity/associated sxs/prior Treatment) Patient is a 25 y.o. male presenting with abdominal pain. The history is provided by the patient.  Abdominal Pain Pain location:  RLQ Pain quality: aching   Pain radiates to:  Does not radiate Pain severity:  Moderate Onset quality:  Gradual Duration:  3 days Timing:  Constant Progression:  Unchanged Chronicity:  New Context: not trauma   Relieved by:  Nothing Worsened by:  Nothing tried Ineffective treatments:  None tried Associated symptoms: diarrhea, nausea and vomiting   Diarrhea:    Quality:  Watery   Severity:  Moderate   Duration:  1 day   Timing:  Intermittent   Progression:  Unchanged Risk factors: no NSAID use     Past Medical History  Diagnosis Date  . Seizures (HCC)   . 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 Hernia repair     Family History  Problem Relation Age of Onset  . Diabetes Father    Social History  Substance Use Topics  . Smoking status: Current Every Day Smoker -- 0.50 packs/day    Types: Cigarettes  . Smokeless tobacco: None  . Alcohol Use: Yes     Comment: occ    Review of Systems  Gastrointestinal: Positive for nausea, vomiting, abdominal pain and diarrhea.  All other systems reviewed and are negative.     Allergies  Review of patient's allergies indicates no known allergies.  Home Medications   Prior to Admission medications   Medication Sig Start Date End Date Taking? Authorizing Provider  cetirizine (ZYRTEC) 10 MG tablet Take 1 tablet (10 mg total) by mouth at bedtime. 12/27/12   Sherren Mocha, MD  chlorhexidine (HIBICLENS) 4 % external liquid Apply 15 mLs (1 application total) topically daily as needed. 12/27/12   Sherren Mocha, MD  ergocalciferol (VITAMIN D2) 50000 UNITS capsule Take 1 capsule (50,000 Units total) by mouth once a week. 12/31/12   Sherren Mocha, MD  fluticasone (FLONASE) 50 MCG/ACT nasal spray Place 2 sprays into the nose at bedtime. 12/27/12   Sherren Mocha, MD  HYDROcodone-acetaminophen (NORCO/VICODIN) 5-325 MG per tablet Take 1-2 tablets every 6 hours as needed for severe pain 03/26/15   Renne Crigler, PA-C  ibuprofen (ADVIL,MOTRIN) 200 MG tablet Take 400 mg by mouth daily as needed. For pain    Historical Provider, MD  mupirocin ointment (BACTROBAN) 2 % Apply topically 3 (three) times daily. 12/27/12   Sherren Mocha, MD  nystatin-triamcinolone ointment [facility]) Apply topically 3 (three) times daily. 12/27/12   Sherren Mocha, MD  Testosterone (ANDROGEL) 20.25 MG/1.25GM (1.62%) GEL Place 4 Squirts onto the skin daily. 03/14/13   Carmelina Dane, MD  testosterone (ANDROGEL) 50 MG/5GM GEL Place 10 g onto the skin daily. 03/20/13   Carmelina Dane, MD   BP 152/88 mmHg  Pulse 87  Temp(Src) 98.3 F (36.8 C) (Oral)  Resp 20  Ht 6' (1.829 m)  Wt 320 lb (145.151 kg)  BMI 43.39 kg/m2  SpO2 97% Physical Exam  Constitutional: He is oriented to person, place, and time. He appears well-developed and well-nourished. No distress.  HENT:  Head: Normocephalic and atraumatic.  Mouth/Throat: Oropharynx is clear and moist.  Eyes: Conjunctivae are normal. Pupils are equal, round, and reactive to light.  Neck: Normal range of motion. Neck supple.  Cardiovascular: Normal rate, regular rhythm and intact distal pulses.   Pulmonary/Chest: Effort normal and breath sounds normal. No respiratory distress. He has no wheezes. He has no rales.  Abdominal: Soft. Bowel sounds are increased. There is no rebound and no guarding.  Musculoskeletal: Normal range of motion.  Neurological: He is alert and oriented to person, place, and time.  Skin: Skin is warm and dry.  Psychiatric: He has a normal mood and affect.    ED Course   Procedures (including critical care time) Labs Review Labs Reviewed  COMPREHENSIVE METABOLIC PANEL - Abnormal; Notable for the following:    Chloride 98 (*)    Glucose, Bld 104 (*)    All other components within normal limits  URINALYSIS, ROUTINE W REFLEX MICROSCOPIC (NOT AT [facility])  CBC WITH DIFFERENTIAL/PLATELET    Imaging Review No results found. I have personally reviewed and evaluated these images and lab results as part of my medical decision-making.   EKG Interpretation None      MDM   Final diagnoses:  None    Results for orders placed or performed during the hospital encounter of 10/11/15  Urinalysis, Routine w reflex microscopic (not at [facility])  Result Value Ref Range   Color, Urine YELLOW YELLOW   APPearance CLEAR CLEAR   Specific Gravity, Urine 1.030 1.005 - 1.030   pH 6.0 5.0 - 8.0   Glucose, UA NEGATIVE NEGATIVE mg/dL   Hgb urine dipstick NEGATIVE NEGATIVE   Bilirubin Urine NEGATIVE NEGATIVE   Ketones, ur NEGATIVE NEGATIVE mg/dL   Protein, ur NEGATIVE NEGATIVE mg/dL   Nitrite NEGATIVE NEGATIVE   Leukocytes, UA NEGATIVE NEGATIVE  CBC with Differential  Result Value Ref Range   WBC 8.1 4.0 - 10.5 K/uL   RBC 5.36 4.22 - 5.81 MIL/uL   Hemoglobin 14.8 13.0 - 17.0 g/dL   HCT 95.2 84.1 - 32.4 %   MCV 84.0 78.0 - 100.0 fL   MCH 27.6 26.0 - 34.0 pg   MCHC 32.9 30.0 - 36.0 g/dL   RDW 40.1 02.7 - 25.3 %   Platelets 344 150 - 400 K/uL   Neutrophils Relative % 75 %   Neutro Abs 6.1 1.7 - 7.7 K/uL   Lymphocytes Relative 17 %   Lymphs Abs 1.3 0.7 - 4.0 K/uL   Monocytes Relative 5 %   Monocytes Absolute 0.4 0.1 - 1.0 K/uL   Eosinophils Relative 3 %   Eosinophils Absolute 0.2 0.0 - 0.7 K/uL   Basophils Relative 0 %   Basophils Absolute 0.0 0.0 - 0.1 K/uL  Comprehensive metabolic panel  Result Value Ref Range   Sodium 136 135 - 145 mmol/L   Potassium 3.8 3.5 - 5.1 mmol/L   Chloride 98 (L) 101 - 111 mmol/L   CO2 26 22 - 32 mmol/L   Glucose, Bld 104 (H) 65 -  99 mg/dL   BUN 13 6 - 20 mg/dL   Creatinine, Ser 6.64 0.61 - 1.24 mg/dL   Calcium 8.9 8.9 - 40.3 mg/dL   Total Protein 8.0 6.5 - 8.1 g/dL   Albumin 4.1 3.5 - 5.0 g/dL   AST 15 15 - 41 U/L   ALT 19 17 - 63 U/L   Alkaline Phosphatase 75 38 - 126 U/L   Total Bilirubin 0.6 0.3 - 1.2 mg/dL   GFR calc non Af Amer >60 >60 mL/min  GFR calc Af Amer >60 >60 mL/min   Anion gap 12 5 - 15   Ct Abdomen Pelvis Wo Contrast  10/11/2015  CLINICAL DATA:  Right lower quadrant pain for 3 days. Nausea, vomiting, and diarrhea. EXAM: CT ABDOMEN AND PELVIS WITHOUT CONTRAST TECHNIQUE: Multidetector CT imaging of the abdomen and pelvis was performed following the standard protocol without IV contrast. COMPARISON:  06/06/2015 FINDINGS: Lower chest and abdominal wall: Epigastric hernia containing infiltrated fat, chronic and stable. Probable prior umbilical hernia repair with stable appearance. Hepatobiliary: No focal liver abnormality.No evidence of biliary obstruction or stone. Pancreas: Unremarkable. Spleen: Unremarkable. Adrenals/Urinary Tract: Negative adrenals. No hydronephrosis or stone. Unremarkable bladder. Reproductive:Negative. Stomach/Bowel:  No obstruction. No appendicitis. Vascular/Lymphatic: No acute vascular abnormality. No mass or adenopathy. Peritoneal: No ascites or pneumoperitoneum. Musculoskeletal: No acute abnormalities. IMPRESSION: 1. No acute finding.  No appendicitis or urolithiasis. 2. Chronic epigastric hernia containing congested or scarred fat. Electronically Signed   By: Marnee SpringJonathon  Watts M.D.   On: 10/11/2015 04:22    Will treat for nausea and vomiting and give bland diet instructions.  Close follow up with your PMD    Moniqua Engebretsen, MD 10/11/15 [phone]

## 2015-10-11 NOTE — ED Notes (Signed)
Xray notified of pt being finished with contrast.

## 2016-05-13 ENCOUNTER — Encounter (HOSPITAL_BASED_OUTPATIENT_CLINIC_OR_DEPARTMENT_OTHER): Payer: Self-pay | Admitting: Emergency Medicine

## 2016-05-13 ENCOUNTER — Emergency Department (HOSPITAL_BASED_OUTPATIENT_CLINIC_OR_DEPARTMENT_OTHER): Payer: Self-pay

## 2016-05-13 ENCOUNTER — Emergency Department (HOSPITAL_BASED_OUTPATIENT_CLINIC_OR_DEPARTMENT_OTHER)
Admission: EM | Admit: 2016-05-13 | Discharge: 2016-05-13 | Disposition: A | Discharge status: Home / Self Care | Payer: Self-pay | Attending: Emergency Medicine | Admitting: Emergency Medicine

## 2016-05-13 DIAGNOSIS — J0110 Acute frontal sinusitis, unspecified: Secondary | ICD-10-CM | POA: Insufficient documentation

## 2016-05-13 DIAGNOSIS — F17210 Nicotine dependence, cigarettes, uncomplicated: Secondary | ICD-10-CM | POA: Insufficient documentation

## 2016-05-13 MED ORDER — AMOXICILLIN 500 MG PO CAPS: 500.0000 mg | ORAL_CAPSULE | Freq: Three times a day (TID) | ORAL | 0 refills | Status: DC

## 2016-05-13 MED FILL — AMOXICILLIN 500 MG CAPSULE: 500 | 10 days supply | Qty: 30 | Fill #0

## 2016-05-13 NOTE — ED Triage Notes (Addendum)
Cough, congestion, HA, sore throat for "months".

## 2016-05-13 NOTE — ED Provider Notes (Signed)
[facility] Provider Note   CSN: [id number] Arrival date & time: 05/13/16  1032     History   Chief Complaint Chief Complaint  Patient presents with  . Cough    HPI Danny Gordon is a 25 y.o. male.  The history is provided by the patient. No language interpreter was used.  Cough  This is a new problem. The problem occurs constantly. The problem has been gradually worsening. The cough is productive of sputum. There has been no fever. Associated symptoms include rhinorrhea. He has tried nothing for the symptoms. Risk factors include travel to endemic areas. He is a smoker. His past medical history does not include asthma.  Pt complains of 2.5 weeks of sinus congestion and fever.   Past Medical History:  Diagnosis Date  . Exposure to TB   . Seizures [facility](HCC)     Patient Active Problem List   Diagnosis Date Noted  . Unspecified vitamin D deficiency 12/31/2012    Past Surgical History:  Procedure Laterality Date  . HERNIA REPAIR         Home Medications    Prior to Admission medications   Medication Sig Start Date End Date Taking? Authorizing Provider  amoxicillin (AMOXIL) 500 MG capsule Take 1 capsule (500 mg total) by mouth 3 (three) times daily. 05/13/16   Elson AreasLeslie K Sofia, PA-C  cetirizine (ZYRTEC) 10 MG tablet Take 1 tablet (10 mg total) by mouth at bedtime. 12/27/12   Sherren MochaEva N Shaw, MD  chlorhexidine (HIBICLENS) 4 % external liquid Apply 15 mLs (1 application total) topically daily as needed. 12/27/12   Sherren MochaEva N Shaw, MD  ergocalciferol (VITAMIN D2) 50000 UNITS capsule Take 1 capsule (50,000 Units total) by mouth once a week. 12/31/12   Sherren MochaEva N Shaw, MD  fluticasone (FLONASE) 50 MCG/ACT nasal spray Place 2 sprays into the nose at bedtime. 12/27/12   Sherren MochaEva N Shaw, MD  HYDROcodone-acetaminophen (NORCO/VICODIN) 5-325 MG per tablet Take 1-2 tablets every 6 hours as needed for severe pain 03/26/15   Renne CriglerJoshua Geiple, PA-C  hyoscyamine (LEVSIN/SL) 0.125 MG SL tablet Place 1  tablet (0.125 mg total) under the tongue every 4 (four) hours as needed. 10/11/15   April Palumbo, MD  ibuprofen (ADVIL,MOTRIN) 200 MG tablet Take 400 mg by mouth daily as needed. For pain    Historical Provider, MD  mupirocin ointment (BACTROBAN) 2 % Apply topically 3 (three) times daily. 12/27/12   Sherren MochaEva N Shaw, MD  nystatin-triamcinolone ointment [facility](MYCOLOG) Apply topically 3 (three) times daily. 12/27/12   Sherren MochaEva N Shaw, MD  ondansetron (ZOFRAN ODT) 8 MG disintegrating tablet 8mg  ODT q8 hours prn nausea 10/11/15   April Palumbo, MD  Testosterone (ANDROGEL) 20.25 MG/1.25GM (1.62%) GEL Place 4 Squirts onto the skin daily. 03/14/13   [name], MD  testosterone (ANDROGEL) 50 MG/5GM GEL Place 10 g onto the skin daily. 03/20/13   [name], MD    Family History Family History  Problem Relation Age of Onset  . Diabetes Father     Social History Social History  Substance Use Topics  . Smoking status: Current Every Day Smoker    Packs/day: 0.50    Types: Cigarettes  . Smokeless tobacco: Never Used  . Alcohol use Yes     Comment: occ     Allergies   Review of patient's allergies indicates no known allergies.   Review of Systems Review of Systems  HENT: Positive for rhinorrhea.   Respiratory: Positive for cough.  All other systems reviewed and are negative.    Physical Exam Updated Vital Signs BP 141/84 (BP Location: Left Arm)   Pulse 69   Temp 97.4 F (36.3 C) (Oral)   Resp 18   Ht 6' (1.829 m)   Wt (!) 147.4 kg   SpO2 100%   BMI 44.08 kg/m   Physical Exam  Constitutional: He appears well-developed and well-nourished.  HENT:  Head: Normocephalic and atraumatic.  Right Ear: External ear normal.  Tender fraontal and maxillary sinuses  Eyes: Conjunctivae are normal.  Neck: Neck supple.  Cardiovascular: Normal rate and regular rhythm.   No murmur heard. Pulmonary/Chest: Effort normal and breath sounds normal. No respiratory distress.  Abdominal: Soft. There  is no tenderness.  Musculoskeletal: He exhibits no edema.  Neurological: He is alert.  Skin: Skin is warm and dry.  Psychiatric: He has a normal mood and affect.  Nursing note and vitals reviewed.    ED Treatments / Results  Labs (all labs ordered are listed, but only abnormal results are displayed) Labs Reviewed - No data to display  EKG  EKG Interpretation None       Radiology Dg Chest 2 View  Result Date: 05/13/2016 CLINICAL DATA:  Is cough for 1 month.  Congestive for 2 months. EXAM: CHEST  2 VIEW COMPARISON:  None. FINDINGS: The lungs are clear wiithout focal pneumonia, edema, pneumothorax or pleural effusion. The cardiopericardial silhouette is within normal limits for size. The visualized bony structures of the thorax are intact. IMPRESSION: No active cardiopulmonary disease. Electronically Signed   By: Kennith Center M.D.   On: 05/13/2016 11:16    Procedures Procedures (including critical care time)  Medications Ordered in ED Medications - No data to display   Initial Impression / Assessment and Plan / ED Course  I have reviewed the triage vital signs and the nursing notes.  Pertinent labs & imaging results that were available during my care of the patient were reviewed by me and considered in my medical decision making (see chart for details).  Clinical Course      Final Clinical Impressions(s) / ED Diagnoses   Final diagnoses:  Acute frontal sinusitis, recurrence not specified  Subacute frontal sinusitis    New Prescriptions New Prescriptions   AMOXICILLIN (AMOXIL) 500 MG CAPSULE    Take 1 capsule (500 mg total) by mouth 3 (three) times daily.  An After Visit Summary was printed and given to the patient.   Lonia Skinner [place], PA-C 05/13/16 1130    Nelva Nay, MD 05/14/16 [phone]

## 2016-05-13 NOTE — ED Notes (Signed)
Patient transported to X-ray 

## 2016-05-19 ENCOUNTER — Encounter (HOSPITAL_BASED_OUTPATIENT_CLINIC_OR_DEPARTMENT_OTHER): Payer: Self-pay | Admitting: *Deleted

## 2016-05-19 ENCOUNTER — Emergency Department (HOSPITAL_BASED_OUTPATIENT_CLINIC_OR_DEPARTMENT_OTHER)
Admission: EM | Admit: 2016-05-19 | Discharge: 2016-05-19 | Disposition: A | Discharge status: Home / Self Care | Attending: Emergency Medicine | Admitting: Emergency Medicine

## 2016-05-19 DIAGNOSIS — Z7951 Long term (current) use of inhaled steroids: Secondary | ICD-10-CM | POA: Insufficient documentation

## 2016-05-19 DIAGNOSIS — K429 Umbilical hernia without obstruction or gangrene: Secondary | ICD-10-CM | POA: Insufficient documentation

## 2016-05-19 DIAGNOSIS — F17210 Nicotine dependence, cigarettes, uncomplicated: Secondary | ICD-10-CM | POA: Insufficient documentation

## 2016-05-19 DIAGNOSIS — K5900 Constipation, unspecified: Secondary | ICD-10-CM | POA: Insufficient documentation

## 2016-05-19 DIAGNOSIS — Z79899 Other long term (current) drug therapy: Secondary | ICD-10-CM | POA: Insufficient documentation

## 2016-05-19 DIAGNOSIS — Z791 Long term (current) use of non-steroidal anti-inflammatories (NSAID): Secondary | ICD-10-CM | POA: Insufficient documentation

## 2016-05-19 MED ORDER — HYDROCODONE-ACETAMINOPHEN 5-325 MG PO TABS: 1.0000 | ORAL_TABLET | Freq: Four times a day (QID) | ORAL | 0 refills | Status: DC | PRN

## 2016-05-19 NOTE — ED Triage Notes (Signed)
States he feels like his stomach is heating up. Pain in his abdomen.

## 2016-05-19 NOTE — ED Notes (Signed)
States " My stomach has been heating up " for a while. Concerned that he might have another hernia to abd, pain to mid abd x 24hrs, diarhhea

## 2016-05-19 NOTE — Discharge Instructions (Signed)
Hydrocodone as prescribed as needed for pain.  Follow-up with central  surgery in the next few days for a recheck of your hernia and to discuss possible surgical options.  Return to the emergency department if you develop worsening pain, high fever, vomiting, bloody stools, or other new and concerning symptoms.

## 2016-05-19 NOTE — ED Provider Notes (Signed)
[facility] Provider Note   CSN: [id number] Arrival date & time: 05/19/16  2148 By signing my name below, I, Levon Hedger, attest that this documentation has been prepared under the direction and in the presence of Geoffery Lyons, MD . Electronically Signed: Levon Hedger, Scribe. 05/19/2016. 10:17 PM.   History   Chief Complaint Chief Complaint  Patient presents with  . Abdominal Pain   HPI Danny Gordon is a 25 y.o. male with hx of hernia who presents to the Emergency Department complaining of epigastric abdominal pain onset yesterday. He describes his pain as burning and state is feels as if his abdomen "is heating up". Per pt, this feels similar to hernias in the past.  Pt notes associated constipation, and nausea. His hernia repair surgery was performed at [facility], but he is not currently followed by a Careers adviser. He states he had a CT scan done in 03/17 which revealed a hernia; he was given follow up information for surgery, but never contacted the surgeon. Pt is employed as a Education administrator. He denies any vomiting or fever.   The history is provided by the patient. No language interpreter was used.  Abdominal Pain   This is a recurrent problem. The current episode started yesterday. The problem occurs constantly. The pain is located in the epigastric region. Associated symptoms include nausea and constipation. Pertinent negatives include vomiting. Past workup includes CT scan and surgery.    Past Medical History:  Diagnosis Date  . Exposure to TB   . Seizures [facility])     Patient Active Problem List   Diagnosis Date Noted  . Unspecified vitamin D deficiency 12/31/2012   Past Surgical History:  Procedure Laterality Date  . HERNIA REPAIR       Home Medications    Prior to Admission medications   Medication Sig Start Date End Date Taking? Authorizing Provider  amoxicillin (AMOXIL) 500 MG capsule Take 1 capsule (500 mg total) by mouth 3 (three) times daily.  05/13/16   Elson Areas, PA-C  cetirizine (ZYRTEC) 10 MG tablet Take 1 tablet (10 mg total) by mouth at bedtime. 12/27/12   Sherren Mocha, MD  chlorhexidine (HIBICLENS) 4 % external liquid Apply 15 mLs (1 application total) topically daily as needed. 12/27/12   Sherren Mocha, MD  ergocalciferol (VITAMIN D2) 50000 UNITS capsule Take 1 capsule (50,000 Units total) by mouth once a week. 12/31/12   Sherren Mocha, MD  fluticasone (FLONASE) 50 MCG/ACT nasal spray Place 2 sprays into the nose at bedtime. 12/27/12   Sherren Mocha, MD  HYDROcodone-acetaminophen (NORCO/VICODIN) 5-325 MG per tablet Take 1-2 tablets every 6 hours as needed for severe pain 03/26/15   Renne Crigler, PA-C  hyoscyamine (LEVSIN/SL) 0.125 MG SL tablet Place 1 tablet (0.125 mg total) under the tongue every 4 (four) hours as needed. 10/11/15   April Palumbo, MD  ibuprofen (ADVIL,MOTRIN) 200 MG tablet Take 400 mg by mouth daily as needed. For pain    Historical Provider, MD  mupirocin ointment (BACTROBAN) 2 % Apply topically 3 (three) times daily. 12/27/12   Sherren Mocha, MD  nystatin-triamcinolone ointment [facility]) Apply topically 3 (three) times daily. 12/27/12   Sherren Mocha, MD  ondansetron (ZOFRAN ODT) 8 MG disintegrating tablet 8mg  ODT q8 hours prn nausea 10/11/15   April Palumbo, MD  Testosterone (ANDROGEL) 20.25 MG/1.25GM (1.62%) GEL Place 4 Squirts onto the skin daily. 03/14/13   Carmelina Dane, MD  testosterone (ANDROGEL) 50 MG/5GM GEL  Place 10 g onto the skin daily. 03/20/13   [name], MD    Family History Family History  Problem Relation Age of Onset  . Diabetes Father     Social History Social History  Substance Use Topics  . Smoking status: Current Every Day Smoker    Packs/day: 0.50    Types: Cigarettes  . Smokeless tobacco: Never Used  . Alcohol use Yes     Comment: occ     Allergies   Review of patient's allergies indicates no known allergies.   Review of Systems Review of Systems  Gastrointestinal: Positive for  abdominal pain, constipation and nausea. Negative for vomiting.   Physical Exam Updated Vital Signs BP 130/78   Pulse 91   Temp 98.1 F (36.7 C) (Oral)   Resp 18   Ht 6' (1.829 m)   Wt (!) 325 lb (147.4 kg)   SpO2 98%   BMI 44.08 kg/m   Physical Exam  Constitutional: He is oriented to person, place, and time. He appears well-developed and well-nourished.  HENT:  Head: Normocephalic and atraumatic.  Eyes: EOM are normal.  Neck: Normal range of motion.  Cardiovascular: Normal rate, regular rhythm, normal heart sounds and intact distal pulses.   Pulmonary/Chest: Effort normal and breath sounds normal. No respiratory distress.  Abdominal: Soft. He exhibits no distension. There is no tenderness.  There is a previous umbilical hernia scar present superior to the umbilicus. There is a large palpable hernia present. It is easily reducible.    Musculoskeletal: Normal range of motion.  Neurological: He is alert and oriented to person, place, and time.  Skin: Skin is warm and dry.  Psychiatric: He has a normal mood and affect. Judgment normal.  Nursing note and vitals reviewed.  ED Treatments / Results  DIAGNOSTIC STUDIES:  Oxygen Saturation is 98% on RA, normal by my interpretation.    COORDINATION OF CARE:  10:16 PM Pt to follow up with surgery. Discussed treatment plan with pt at bedside and pt agreed to plan.  Labs (all labs ordered are listed, but only abnormal results are displayed) Labs Reviewed - No data to display  EKG  EKG Interpretation None       Radiology No results found.  Procedures Procedures (including critical care time)  Medications Ordered in ED Medications - No data to display   Initial Impression / Assessment and Plan / ED Course  I have reviewed the triage vital signs and the nursing notes.  Pertinent labs & imaging results that were available during my care of the patient were reviewed by me and considered in my medical decision making (see  chart for details).  Clinical Course   Patient appears to have a recurrent ventral hernia. It is easily reducible and he has no evidence of incarceration or small bowel obstruction. He has been advised to follow-up with general surgery in the past, however he does not have health insurance. He will be given pain medication this evening and advised to follow-up with [facility]. Their contact information has been given to him so he can make these arrangements.  Final Clinical Impressions(s) / ED Diagnoses   Final diagnoses:  None   New Prescriptions New Prescriptions   No medications on file  I personally performed the services described in this documentation, which was scribed in my presence. The recorded information has been reviewed and is accurate.        Geoffery Lyonsouglas Ernst Cumpston, MD 05/19/16 2256

## 2016-05-25 ENCOUNTER — Emergency Department (HOSPITAL_BASED_OUTPATIENT_CLINIC_OR_DEPARTMENT_OTHER)
Admission: EM | Admit: 2016-05-25 | Discharge: 2016-05-25 | Disposition: A | Discharge status: Home / Self Care | Payer: Self-pay | Attending: Emergency Medicine | Admitting: Emergency Medicine

## 2016-05-25 ENCOUNTER — Emergency Department (HOSPITAL_BASED_OUTPATIENT_CLINIC_OR_DEPARTMENT_OTHER): Payer: Self-pay

## 2016-05-25 ENCOUNTER — Encounter (HOSPITAL_BASED_OUTPATIENT_CLINIC_OR_DEPARTMENT_OTHER): Payer: Self-pay | Admitting: *Deleted

## 2016-05-25 DIAGNOSIS — K439 Ventral hernia without obstruction or gangrene: Secondary | ICD-10-CM | POA: Insufficient documentation

## 2016-05-25 DIAGNOSIS — F17210 Nicotine dependence, cigarettes, uncomplicated: Secondary | ICD-10-CM | POA: Insufficient documentation

## 2016-05-25 MED ORDER — ONDANSETRON 8 MG PO TBDP: 8.0000 mg | ORAL_TABLET | Freq: Three times a day (TID) | ORAL | 0 refills | Status: DC | PRN

## 2016-05-25 MED ORDER — LOPERAMIDE HCL 2 MG PO CAPS
4.0000 mg | ORAL_CAPSULE | Freq: Once | ORAL | Status: AC
  Administered 2016-05-25: 4 mg via ORAL
  Filled 2016-05-25: qty 2

## 2016-05-25 MED ORDER — ONDANSETRON 8 MG PO TBDP
8.0000 mg | ORAL_TABLET | Freq: Once | ORAL | Status: AC
  Administered 2016-05-25: 8 mg via ORAL
  Filled 2016-05-25: qty 1

## 2016-05-25 NOTE — ED Notes (Signed)
Back from CT, alert, NAD, calm, interactive, no changes.  

## 2016-05-25 NOTE — ED Notes (Signed)
Dr. Molpus into room 

## 2016-05-25 NOTE — ED Provider Notes (Signed)
[facility] Provider Note: [name], MD, FACEP  CSN: [id number] MRN: [id number] ARRIVAL: 05/25/16 at 0405 ROOM: MH06/MH06   CHIEF COMPLAINT  Abdominal Pain   HISTORY OF PRESENT ILLNESS  Danny Gordon is a 25 y.o. male with a history of a ventral hernia status post umbilical herniorrhaphy. He has chronic pain associated with this hernia. This pain acutely worsened about a week ago when he was seen in the ED. The ventral hernia was reportedly easily reduced and he was discharged home with hydrocodone. He has not gotten the hydrocodone filled as he does not like to take pain medication. He has a ointment with [facility] tomorrow but came in this morning because his pain acutely worsened yesterdayAnd he is concerned about a bowel obstruction or other complication. It has been associated with nausea but no vomiting. He also reports diarrhea and foul-tasting belching.    Past Medical History:  Diagnosis Date  . Exposure to TB   . Seizures (HCC)     Past Surgical History:  Procedure Laterality Date  . HERNIA REPAIR      Family History  Problem Relation Age of Onset  . Diabetes Father     Social History  Substance Use Topics  . Smoking status: Current Every Day Smoker    Packs/day: 0.50    Types: Cigarettes  . Smokeless tobacco: Never Used  . Alcohol use Yes     Comment: occ    Prior to Admission medications   Medication Sig Start Date End Date Taking? Authorizing Provider  amoxicillin (AMOXIL) 500 MG capsule Take 1 capsule (500 mg total) by mouth 3 (three) times daily. 05/13/16   Elson AreasLeslie K Sofia, PA-C  cetirizine (ZYRTEC) 10 MG tablet Take 1 tablet (10 mg total) by mouth at bedtime. 12/27/12   Sherren MochaEva N Shaw, MD  chlorhexidine (HIBICLENS) 4 % external liquid Apply 15 mLs (1 application total) topically daily as needed. 12/27/12   Sherren MochaEva N Shaw, MD  ergocalciferol (VITAMIN D2) 50000 UNITS capsule Take 1 capsule (50,000 Units total) by mouth once a  week. 12/31/12   Sherren MochaEva N Shaw, MD  fluticasone (FLONASE) 50 MCG/ACT nasal spray Place 2 sprays into the nose at bedtime. 12/27/12   Sherren MochaEva N Shaw, MD  HYDROcodone-acetaminophen (NORCO) 5-325 MG tablet Take 1-2 tablets by mouth every 6 (six) hours as needed. 05/19/16   Geoffery Lyonsouglas Delo, MD  hyoscyamine (LEVSIN/SL) 0.125 MG SL tablet Place 1 tablet (0.125 mg total) under the tongue every 4 (four) hours as needed. 10/11/15   April Palumbo, MD  ibuprofen (ADVIL,MOTRIN) 200 MG tablet Take 400 mg by mouth daily as needed. For pain    Historical Provider, MD  mupirocin ointment (BACTROBAN) 2 % Apply topically 3 (three) times daily. 12/27/12   Sherren MochaEva N Shaw, MD  nystatin-triamcinolone ointment [facility]) Apply topically 3 (three) times daily. 12/27/12   Sherren MochaEva N Shaw, MD  ondansetron (ZOFRAN ODT) 8 MG disintegrating tablet 8mg  ODT q8 hours prn nausea 10/11/15   April Palumbo, MD  Testosterone (ANDROGEL) 20.25 MG/1.25GM (1.62%) GEL Place 4 Squirts onto the skin daily. 03/14/13   [name], MD  testosterone (ANDROGEL) 50 MG/5GM GEL Place 10 g onto the skin daily. 03/20/13   [name], MD    Allergies Review of patient's allergies indicates no known allergies.   REVIEW OF SYSTEMS  Negative except as noted here or in the History of Present Illness.   PHYSICAL EXAMINATION  Initial Vital Signs Blood pressure 150/94, pulse  97, temperature 98.6 F (37 C), temperature source Oral, resp. rate 18, SpO2 98 %.  Examination General: Well-developed, obese male in no acute distress; appearance consistent with age of record HENT: normocephalic; atraumatic Eyes: pupils equal, round and reactive to light; extraocular muscles intact Neck: supple Heart: regular rate and rhythm Lungs: clear to auscultation bilaterally Abdomen: soft; nondistended; tender ventral hernia superior to the umbilicus, partially reducible; bowel sounds present Extremities: No deformity; full range of motion; pulses normal Neurologic: Awake, alert  and oriented; motor function intact in all extremities and symmetric; no facial droop Skin: Warm and dry Psychiatric: Normal mood and affect   RESULTS  Summary of this visit's results, reviewed by myself:   EKG Interpretation  Date/Time:    Ventricular Rate:    PR Interval:    QRS Duration:   QT Interval:    QTC Calculation:   R Axis:     Text Interpretation:        Laboratory Studies: No results found for this or any previous visit (from the past 24 hour(s)). Imaging Studies: Ct Abdomen Pelvis Wo Contrast  Result Date: 05/25/2016 CLINICAL DATA:  Abdominal pain, nausea and diarrhea EXAM: CT ABDOMEN AND PELVIS WITHOUT CONTRAST TECHNIQUE: Multidetector CT imaging of the abdomen and pelvis was performed following the standard protocol without IV contrast. COMPARISON:  None. FINDINGS: Lower chest: No acute abnormality. Hepatobiliary: No focal liver abnormality is seen. No gallstones, gallbladder wall thickening, or biliary dilatation. Pancreas: Unremarkable. No pancreatic ductal dilatation or surrounding inflammatory changes. Spleen: Normal in size without focal abnormality. Adrenals/Urinary Tract: Adrenal glands are unremarkable. Kidneys are normal, without renal calculi, focal lesion, or hydronephrosis. Bladder is nearly empty but grossly unremarkable. Stomach/Bowel: Stomach is within normal limits. Appendix appears normal. No evidence of bowel wall thickening, distention, or inflammatory changes. Vascular/Lymphatic: No significant vascular findings are present. No enlarged abdominal or pelvic lymph nodes. Reproductive: Prostate is unremarkable. Other: Unchanged ventral hernia containing omentum or congested fat. No herniated bowel. Musculoskeletal: No acute or significant osseous findings. IMPRESSION: Unchanged ventral hernia containing omentum or congested fat. No herniated bowel. Electronically Signed   By: Ellery Plunkaniel R Mitchell M.D.   On: 05/25/2016 05:25    ED COURSE  Nursing notes and  initial vitals signs, including pulse oximetry, reviewed.  Vitals:   05/25/16 0423  BP: 150/94  Pulse: 97  Resp: 18  Temp: 98.6 F (37 C)  TempSrc: Oral  SpO2: 98%    Patient advised of CT findings, will follow up with [facility] tomorrow. He was advised that he may benefit from having his hydrocodone prescription filled.  PROCEDURES    ED DIAGNOSES     ICD-9-CM ICD-10-CM   1. Ventral hernia without obstruction or gangrene 553.20 K43.9        Paula LibraJohn Katlin Bortner, MD 05/25/16 [phone]

## 2016-05-25 NOTE — ED Triage Notes (Signed)
C/o abd pain, relates to hernia, also nausea and diarrhea, h/o hernia repair, "this hernia pain is becoming increasingly more frequent", last episode/ flare up was 1 week ago, denies fever or vomiting.

## 2016-05-25 NOTE — ED Notes (Signed)
CT results reviewed, Dr. Read DriversMolpus into room.

## 2016-08-01 ENCOUNTER — Encounter (HOSPITAL_BASED_OUTPATIENT_CLINIC_OR_DEPARTMENT_OTHER): Payer: Self-pay

## 2016-08-01 ENCOUNTER — Emergency Department (HOSPITAL_BASED_OUTPATIENT_CLINIC_OR_DEPARTMENT_OTHER)
Admission: EM | Admit: 2016-08-01 | Discharge: 2016-08-01 | Disposition: A | Discharge status: Home / Self Care | Attending: Emergency Medicine | Admitting: Emergency Medicine

## 2016-08-01 DIAGNOSIS — F17210 Nicotine dependence, cigarettes, uncomplicated: Secondary | ICD-10-CM | POA: Insufficient documentation

## 2016-08-01 DIAGNOSIS — J329 Chronic sinusitis, unspecified: Secondary | ICD-10-CM | POA: Insufficient documentation

## 2016-08-01 DIAGNOSIS — Z79899 Other long term (current) drug therapy: Secondary | ICD-10-CM | POA: Insufficient documentation

## 2016-08-01 DIAGNOSIS — J209 Acute bronchitis, unspecified: Secondary | ICD-10-CM | POA: Insufficient documentation

## 2016-08-01 MED ORDER — TRIAMCINOLONE ACETONIDE 55 MCG/ACT NA AERO: 2.0000 | INHALATION_SPRAY | Freq: Every day | NASAL | Status: DC

## 2016-08-01 MED ORDER — ALBUTEROL SULFATE HFA 108 (90 BASE) MCG/ACT IN AERS
2.0000 | INHALATION_SPRAY | RESPIRATORY_TRACT | Status: DC | PRN
  Administered 2016-08-01: 2 via RESPIRATORY_TRACT
  Filled 2016-08-01: qty 6.7

## 2016-08-01 NOTE — ED Notes (Signed)
Pt verbalizes understanding of dc instructions at this time.  Pt encouraged to contact physician connection line to find PCP as well since it is the first of the year and more places should be able to take underfunded or uninsured patients.

## 2016-08-01 NOTE — ED Triage Notes (Signed)
Pt c/o nasal congestion for a year, wants to "fix it" is the reason for coming in today

## 2016-08-01 NOTE — ED Provider Notes (Signed)
[facility] Provider Note: Danny Gordon Danny Longman, MD, FACEP  CSN: [id number] MRN: [id number] ARRIVAL: 08/01/16 at 0543 ROOM: MH02/MH02   CHIEF COMPLAINT  Nasal Congestion   HISTORY OF PRESENT ILLNESS  Danny Gordon is a 26 y.o. male with a one year history of nasal congestion, greater on the left than the right. He was treated with amoxicillin for acute sinusitis in October of this year without relief. He has also tried Afrin without relief. He also complains of pain in his shoulders when he coughs. He is coughing on deep breathing and is having some shortness of breath. He has noticed occasional bloody mucus from his nose.    Past Medical History:  Diagnosis Date  . Exposure to TB   . Seizures (HCC)     Past Surgical History:  Procedure Laterality Date  . HERNIA REPAIR      Family History  Problem Relation Age of Onset  . Diabetes Father     Social History  Substance Use Topics  . Smoking status: Current Every Day Smoker    Packs/day: 0.50    Types: Cigarettes  . Smokeless tobacco: Never Used  . Alcohol use Yes     Comment: occ    Prior to Admission medications   Medication Sig Start Date End Date Taking? Authorizing Provider  ibuprofen (ADVIL,MOTRIN) 200 MG tablet Take 400 mg by mouth daily as needed. For pain    Historical Provider, MD  ondansetron (ZOFRAN ODT) 8 MG disintegrating tablet Take 1 tablet (8 mg total) by mouth every 8 (eight) hours as needed for nausea or vomiting. 05/25/16   [name], MD  Testosterone (ANDROGEL) 20.25 MG/1.25GM (1.62%) GEL Place 4 Squirts onto the skin daily. 03/14/13   [name], MD  testosterone (ANDROGEL) 50 MG/5GM GEL Place 10 g onto the skin daily. 03/20/13   [name], MD  triamcinolone (NASACORT) 55 MCG/ACT AERO nasal inhaler Place 2 sprays into the nose daily. 08/01/16   [name], MD    Allergies Patient has no known allergies.   REVIEW OF SYSTEMS  Negative except as noted here or in the  History of Present Illness.   PHYSICAL EXAMINATION  Initial Vital Signs Blood pressure 179/90, pulse 87, temperature 97.6 F (36.4 C), temperature source Oral, resp. rate 18, height 6' (1.829 m), weight (!) 340 lb (154.2 kg), SpO2 100 %.  Examination General: Well-developed, obese male in no acute distress; appearance consistent with age of record HENT: normocephalic; atraumatic; nasal congestion; edema of the turbinates, greater on the left Eyes: pupils equal, round and reactive to light; extraocular muscles intact Neck: supple Heart: regular rate and rhythm Lungs: Breath sounds diminished without frank wheezing; coughing on attempted deep breaths Abdomen: soft; nondistended; nontender; bowel sounds present Extremities: No deformity; full range of motion Neurologic: Awake, alert and oriented; motor function intact in all extremities and symmetric; no facial droop Skin: Warm and dry Psychiatric: Normal mood and affect   RESULTS  Summary of this visit's results, reviewed by myself:   EKG Interpretation  Date/Time:    Ventricular Rate:    PR Interval:    QRS Duration:   QT Interval:    QTC Calculation:   R Axis:     Text Interpretation:        Laboratory Studies: No results found for this or any previous visit (from the past 24 hour(s)). Imaging Studies: No results found.  ED COURSE  Nursing notes and initial vitals signs, including pulse oximetry,  reviewed.  Vitals:   08/01/16 0552 08/01/16 0553  BP: 179/90   Pulse: 87   Resp: 18   Temp: 97.6 F (36.4 C)   TempSrc: Oral   SpO2: 100%   Weight:  (!) 340 lb (154.2 kg)  Height:  6' (1.829 m)   Patient was advised that chronic sinusitis does not respond to antibiotics. We will start him on a nasal steroid and refer to ENT. Will also provide an inhaler for his bronchospasm.  PROCEDURES    ED DIAGNOSES     ICD-9-CM ICD-10-CM   1. Chronic recurrent sinusitis 473.9 J32.9   2. Acute bronchitis with bronchospasm  466.0 J20.9        Paula Libra, MD 08/01/16 [phone]

## 2016-11-28 ENCOUNTER — Encounter (HOSPITAL_BASED_OUTPATIENT_CLINIC_OR_DEPARTMENT_OTHER): Payer: Self-pay | Admitting: *Deleted

## 2016-11-28 ENCOUNTER — Emergency Department (HOSPITAL_BASED_OUTPATIENT_CLINIC_OR_DEPARTMENT_OTHER)
Admission: EM | Admit: 2016-11-28 | Discharge: 2016-11-29 | Disposition: A | Discharge status: Home / Self Care | Attending: Physician Assistant | Admitting: Physician Assistant

## 2016-11-28 DIAGNOSIS — F17210 Nicotine dependence, cigarettes, uncomplicated: Secondary | ICD-10-CM | POA: Insufficient documentation

## 2016-11-28 DIAGNOSIS — R110 Nausea: Secondary | ICD-10-CM | POA: Insufficient documentation

## 2016-11-28 DIAGNOSIS — Z791 Long term (current) use of non-steroidal anti-inflammatories (NSAID): Secondary | ICD-10-CM | POA: Insufficient documentation

## 2016-11-28 DIAGNOSIS — R1033 Periumbilical pain: Secondary | ICD-10-CM | POA: Insufficient documentation

## 2016-11-28 DIAGNOSIS — Z79899 Other long term (current) drug therapy: Secondary | ICD-10-CM | POA: Insufficient documentation

## 2016-11-28 MED ORDER — OMEPRAZOLE 20 MG PO CPDR: 20.0000 mg | DELAYED_RELEASE_CAPSULE | Freq: Every day | ORAL | 0 refills | Status: DC

## 2016-11-28 NOTE — ED Provider Notes (Signed)
[facility] Provider Note   CSN: [id number] Arrival date & time: 11/28/16  2153   By signing my name below, I, Clarisse Gouge, attest that this documentation has been prepared under the direction and in the presence of Mackuen, Cindee Salt, MD. Electronically signed, Clarisse Gouge, ED Scribe. 11/28/16. 11:45 PM.   History   Chief Complaint Chief Complaint  Patient presents with  . Abdominal Pain   The history is provided by the patient and medical records. No language interpreter was used.  Abdominal Pain   This is a recurrent problem. The current episode started more than 1 week ago. The problem occurs daily. The problem has been gradually worsening. The pain is associated with an unknown factor. The quality of the pain is sharp. The pain is severe. Associated symptoms include nausea. The symptoms are aggravated by certain positions, palpation and activity. The symptoms are relieved by being still and certain positions.    Danny Gordon is a 26 y.o. male with h/o hernia repair, who presents to the Emergency Department with concern for gradually worsening, recurrent, intermittent abdominal pain x 1 month. No pain currently. Associated nausea noted. He describes random onset, moderate to severe, sharp central abdominal pain near the site of a past hernia. He states the pain subsides without intervention after 20-30 minutes. His significant other states the pt's pain is always worse on the days following he alcohol or eats fatty food consumption. No other modifying factors noted. No other complaints at this time.    Past Medical History:  Diagnosis Date  . Exposure to TB   . Seizures [facility])     Patient Active Problem List   Diagnosis Date Noted  . Unspecified vitamin D deficiency 12/31/2012    Past Surgical History:  Procedure Laterality Date  . HERNIA REPAIR         Home Medications    Prior to Admission medications   Medication Sig Start Date End Date  Taking? Authorizing Provider  ibuprofen (ADVIL,MOTRIN) 200 MG tablet Take 400 mg by mouth daily as needed. For pain    [provider]  ondansetron (ZOFRAN ODT) 8 MG disintegrating tablet Take 1 tablet (8 mg total) by mouth every 8 (eight) hours as needed for nausea or vomiting. 05/25/16   Molpus, John, MD  Testosterone (ANDROGEL) 20.25 MG/1.25GM (1.62%) GEL Place 4 Squirts onto the skin daily. 03/14/13   Carmelina Dane, MD  testosterone (ANDROGEL) 50 MG/5GM GEL Place 10 g onto the skin daily. 03/20/13   Carmelina Dane, MD  triamcinolone (NASACORT) 55 MCG/ACT AERO nasal inhaler Place 2 sprays into the nose daily. 08/01/16   Molpus, John, MD    Family History Family History  Problem Relation Age of Onset  . Diabetes Father     Social History Social History  Substance Use Topics  . Smoking status: Current Every Day Smoker    Packs/day: 0.50    Types: Cigarettes  . Smokeless tobacco: Never Used  . Alcohol use Yes     Comment: occ     Allergies   Patient has no known allergies.   Review of Systems Review of Systems  Gastrointestinal: Positive for abdominal pain and nausea.  All other systems reviewed and are negative.    Physical Exam Updated Vital Signs BP 135/79 (BP Location: Left Arm)   Pulse 81   Temp 98.4 F (36.9 C) (Oral)   Resp 18   Ht 6' (1.829 m)   Wt (!) 330 lb (149.7  kg)   SpO2 99%   BMI 44.76 kg/m   Physical Exam  Constitutional: He is oriented to person, place, and time. He appears well-developed and well-nourished.  HENT:  Head: Normocephalic and atraumatic.  Eyes: EOM are normal.  Neck: Normal range of motion.  Cardiovascular: Normal rate, regular rhythm, normal heart sounds and intact distal pulses.   Pulmonary/Chest: Effort normal and breath sounds normal. No respiratory distress.  Abdominal: Soft. He exhibits no distension. There is no tenderness.  Ventral hernia with scar, but no hernia present currently  Musculoskeletal:  Normal range of motion.  Neurological: He is alert and oriented to person, place, and time.  Skin: Skin is warm and dry.  Psychiatric: He has a normal mood and affect. Judgment normal.  Nursing note and vitals reviewed.    ED Treatments / Results  DIAGNOSTIC STUDIES: Oxygen Saturation is 99% on RA, NL by my interpretation.    COORDINATION OF CARE: 11:40 PM-Discussed next steps with pt. Pt verbalized understanding and is agreeable with the plan. Will Rx medications. Pt prepared for d/c, pt advised of symptomatic care at home, F/U instructions and return precautions.    Labs (all labs ordered are listed, but only abnormal results are displayed) Labs Reviewed - No data to display  EKG  EKG Interpretation None       Radiology No results found.  Procedures Procedures (including critical care time)  Medications Ordered in ED Medications - No data to display   Initial Impression / Assessment and Plan / ED Course  I have reviewed the triage vital signs and the nursing notes.  Pertinent labs & imaging results that were available during my care of the patient were reviewed by me and considered in my medical decision making (see chart for details).    Patient is a 26 year old male with abdominal pain. Patient reports 2 different types of abdominal pain one happens after he drinks alcohol or eats something spicy. The next morning his girlfriend or notes that he has a lot of abdominal pain. For this we discussed treatment with omeprazole and Mylanta. Patient also notes that sometimes he has sharp stabbing abdominal pain located in the area where his previous hernia repair was. For this we recommend that he goes to follow up with a surgeon. He does not having this pain right this moment. Physical exam shows no evidence of hernia or strangulated hernia. Otherwise very well-appearing male.  Final Clinical Impressions(s) / ED Diagnoses   Final diagnoses:  None    New  Prescriptions New Prescriptions   No medications on file  I personally performed the services described in this documentation, which was scribed in my presence. The recorded information has been reviewed and is accurate.      Abelino DerrickMackuen, Courteney Lyn, MD 11/29/16 [id number]

## 2016-11-28 NOTE — Discharge Instructions (Signed)
We think the stomach pain that you're having after alcohol is likely due to gastritis. Please use Mylanta or Pepto-Bismol at night and in the morning.You may try this Prilosec which may help decrease the acid production in your stomach.  As for the intermittent sharp pains around her umbilicus this could be secondary to your ventral hernia repair. Please follow-up with general surgery.

## 2016-11-28 NOTE — ED Triage Notes (Signed)
PT c/o mid abdominal pain x 1 month that comes and goes, associated with nausea. No meds PTA

## 2017-03-16 IMAGING — CT CT ABD-PELV W/O CM
1 of 2 series · 15 of 32 positions shown, 19 images · non-contrast
Comparison: March 26, 2015

CLINICAL DATA: Upper abdomen pain starting 2 weeks ago.

EXAM:
CT ABDOMEN AND PELVIS WITHOUT CONTRAST
TECHNIQUE: Multidetector CT imaging of the abdomen and pelvis was performed
following the standard protocol without IV contrast.

[Series 2: abd/pelvis 5.0 b31f · axial · 0.97mm/px · z∈[-534,-94]mm · 15 of 97 slices shown, 19 images]
[im 5/97  soft-tissue]
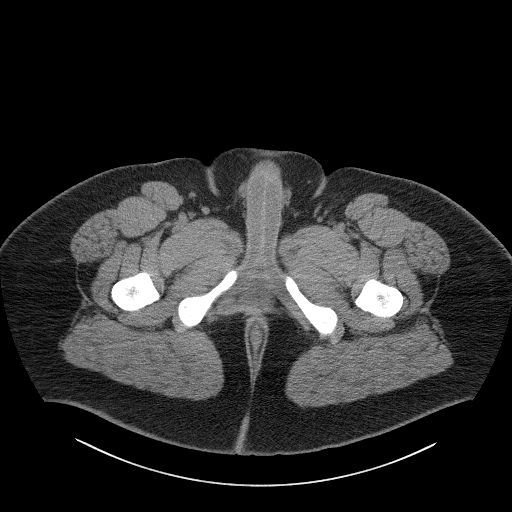
[im 5/97  bone]
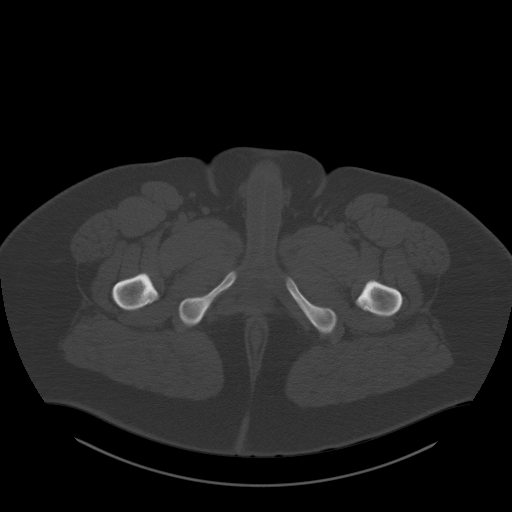
[im 13/97  soft-tissue]
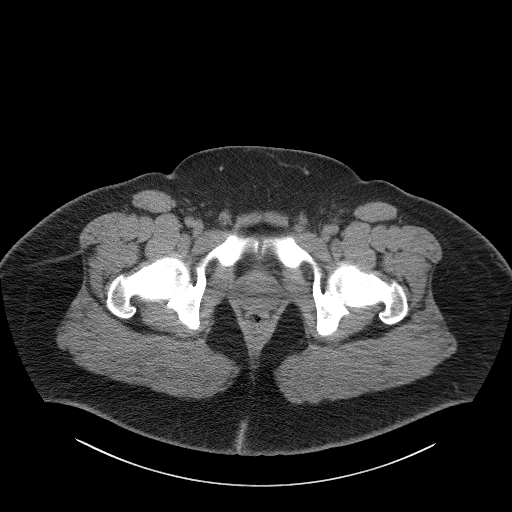
[im 21/97  soft-tissue]
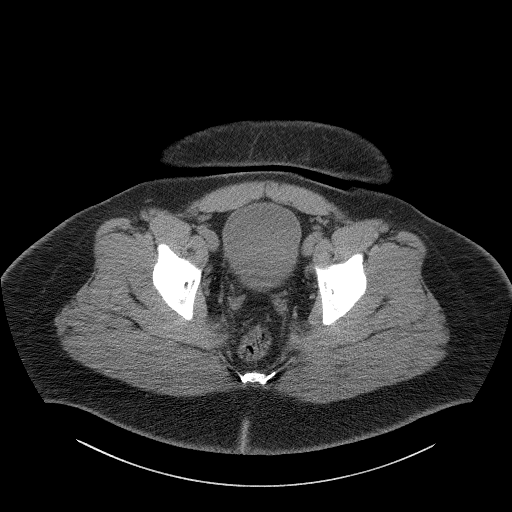
[im 29/97  soft-tissue]
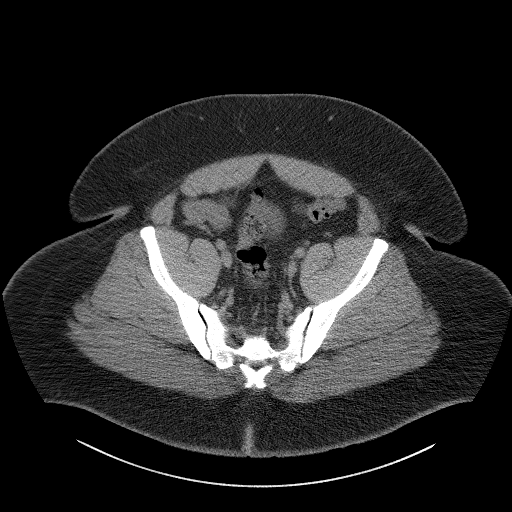
[im 33/97  soft-tissue]
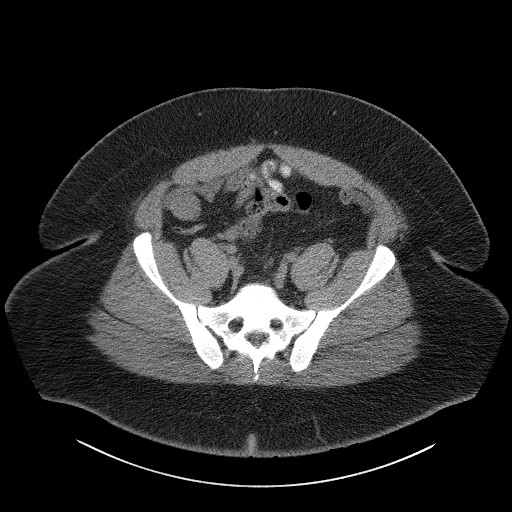
[im 41/97  soft-tissue]
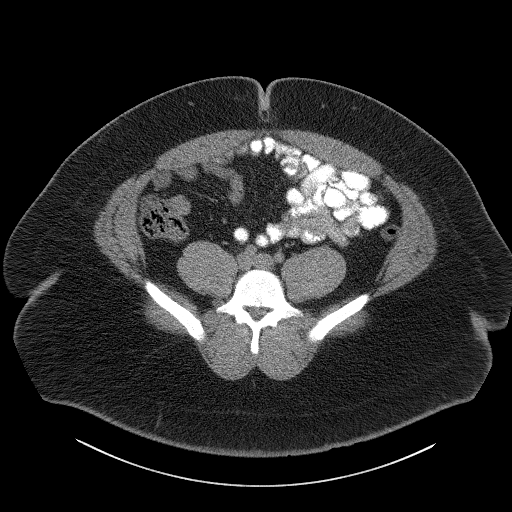
[im 49/97  soft-tissue]
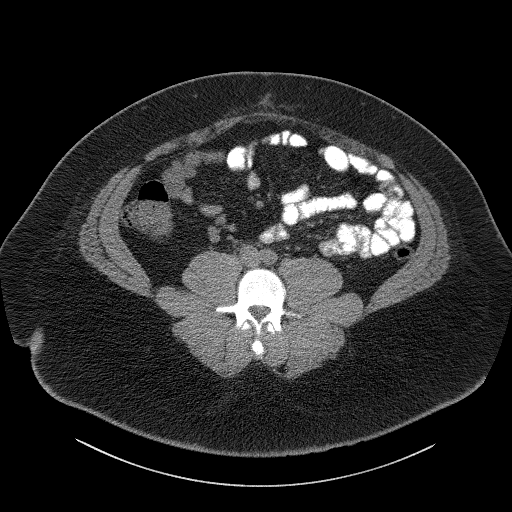
[im 57/97  soft-tissue]
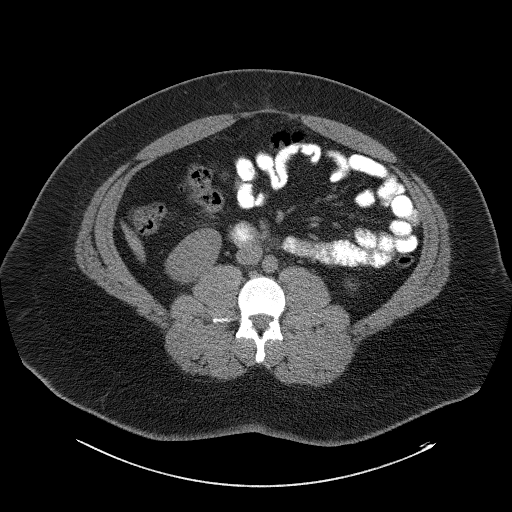
[im 65/97  soft-tissue]
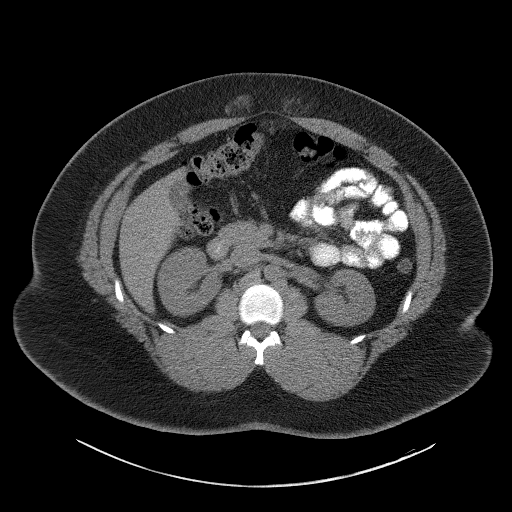
[im 65/97  bone]
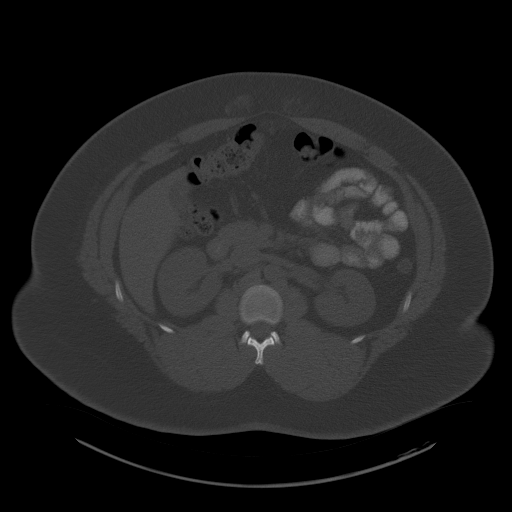
[im 69/97  soft-tissue]
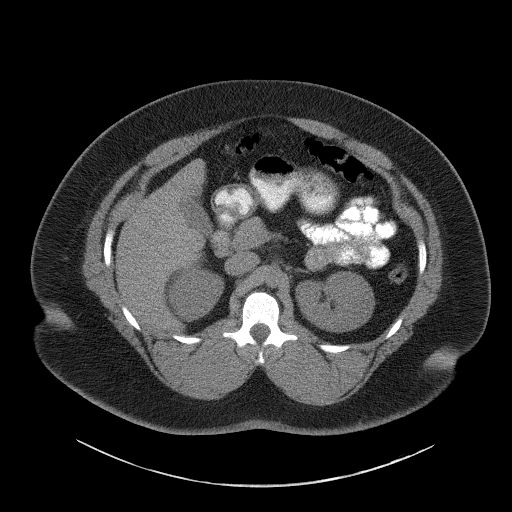
[im 77/97  soft-tissue]
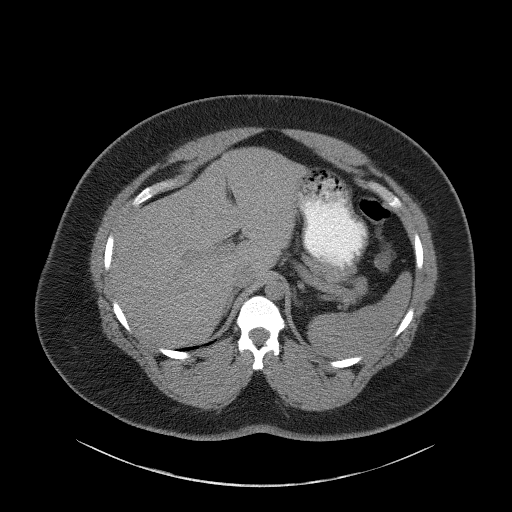
[im 81/97  lung]
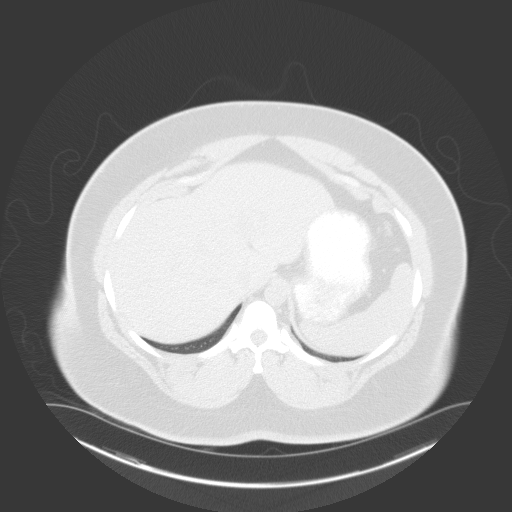
[im 85/97  soft-tissue]
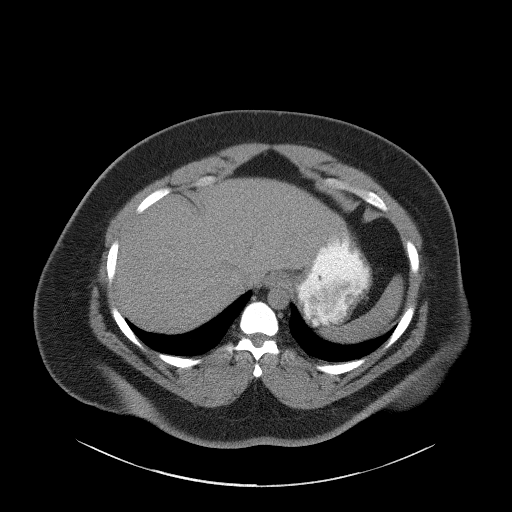
[im 85/97  lung]
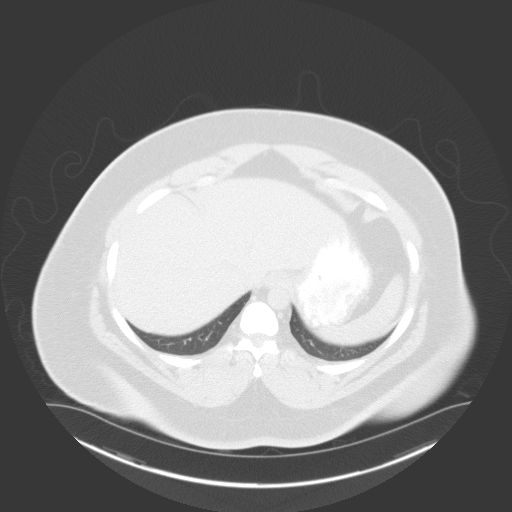
[im 89/97  lung]
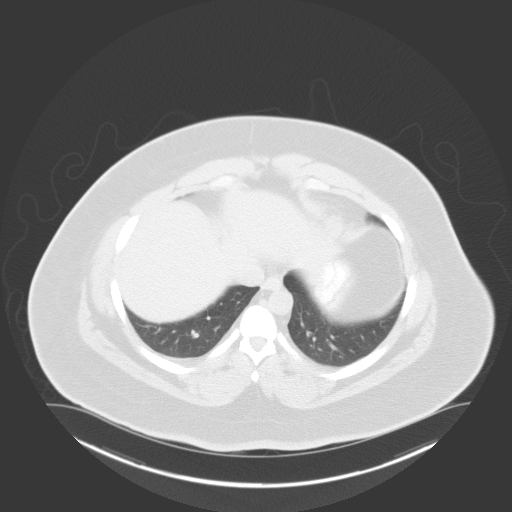
[im 93/97  soft-tissue]
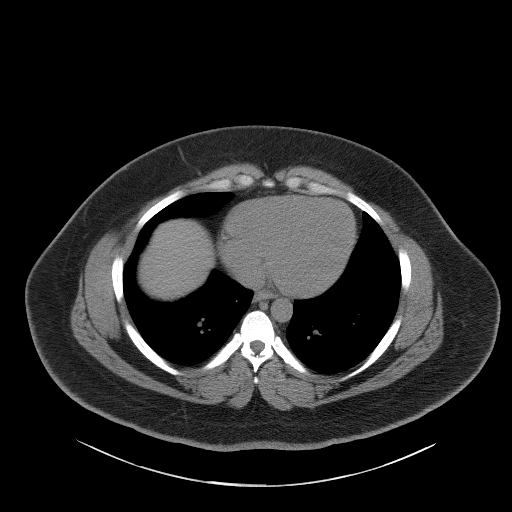
[im 93/97  lung]
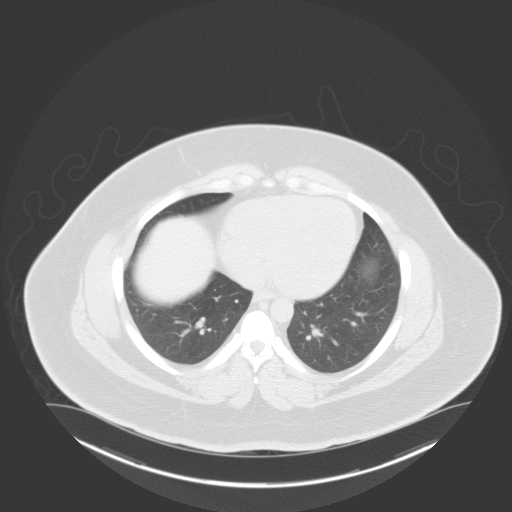

[15 of 32 positions shown; findings below may reference images not displayed]

FINDINGS: The liver, spleen, pancreas, gallbladder, adrenal glands and kidneys
are normal. There is no nephrolithiasis or hydroureteronephrosis
bilaterally. The aorta is normal. There is no abdominal
lymphadenopathy.

There is no small bowel obstruction or diverticulitis. The appendix
is normal.

Anterior abdominal wall demonstrates scatter small abdominal wall
ventral hernias with protrusion of fat unchanged compared to prior
CT. The small hernias are all above the umbilicus.

The bladder is normal. There is no pelvic lymphadenopathy. The lung
bases are clear. No acute abnormalities identified within the bones.
IMPRESSION: No acute abnormality identified in the abdomen and pelvis.

Anterior abdominal wall with scattered small abdominal wall ventral
hernias with protrusions of fat unchanged compared to prior CT.

## 2017-05-28 ENCOUNTER — Encounter (HOSPITAL_BASED_OUTPATIENT_CLINIC_OR_DEPARTMENT_OTHER): Payer: Self-pay | Admitting: *Deleted

## 2017-05-28 ENCOUNTER — Emergency Department (HOSPITAL_BASED_OUTPATIENT_CLINIC_OR_DEPARTMENT_OTHER): Payer: Self-pay

## 2017-05-28 ENCOUNTER — Emergency Department (HOSPITAL_BASED_OUTPATIENT_CLINIC_OR_DEPARTMENT_OTHER)
Admission: EM | Admit: 2017-05-28 | Discharge: 2017-05-28 | Disposition: A | Discharge status: Home / Self Care | Payer: Self-pay | Attending: Emergency Medicine | Admitting: Emergency Medicine

## 2017-05-28 DIAGNOSIS — Y92410 Unspecified street and highway as the place of occurrence of the external cause: Secondary | ICD-10-CM | POA: Insufficient documentation

## 2017-05-28 DIAGNOSIS — F17210 Nicotine dependence, cigarettes, uncomplicated: Secondary | ICD-10-CM | POA: Insufficient documentation

## 2017-05-28 DIAGNOSIS — S161XXA Strain of muscle, fascia and tendon at neck level, initial encounter: Secondary | ICD-10-CM | POA: Insufficient documentation

## 2017-05-28 DIAGNOSIS — Y9389 Activity, other specified: Secondary | ICD-10-CM | POA: Insufficient documentation

## 2017-05-28 DIAGNOSIS — Y998 Other external cause status: Secondary | ICD-10-CM | POA: Insufficient documentation

## 2017-05-28 DIAGNOSIS — Z79899 Other long term (current) drug therapy: Secondary | ICD-10-CM | POA: Insufficient documentation
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
## 2017-05-28 NOTE — ED Notes (Signed)
Pt verbalizes understanding of d/c instructions and denies any further needs at this time. 
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
## 2017-07-27 ENCOUNTER — Emergency Department (HOSPITAL_BASED_OUTPATIENT_CLINIC_OR_DEPARTMENT_OTHER)
Admission: EM | Admit: 2017-07-27 | Discharge: 2017-07-27 | Discharge status: Against Medical Advice | Payer: Self-pay | Attending: Emergency Medicine | Admitting: Emergency Medicine

## 2017-07-27 ENCOUNTER — Encounter (HOSPITAL_BASED_OUTPATIENT_CLINIC_OR_DEPARTMENT_OTHER): Payer: Self-pay | Admitting: Emergency Medicine

## 2017-07-27 ENCOUNTER — Other Ambulatory Visit: Payer: Self-pay

## 2017-07-27 DIAGNOSIS — R079 Chest pain, unspecified: Secondary | ICD-10-CM | POA: Insufficient documentation

## 2017-07-27 DIAGNOSIS — Z5321 Procedure and treatment not carried out due to patient leaving prior to being seen by health care provider: Secondary | ICD-10-CM | POA: Insufficient documentation

## 2017-07-27 NOTE — ED Triage Notes (Signed)
Centralized chest pain for three days.  Pain radiated to neck yesterday.  Felt weakness this am.  No cardiac history.  No upper resp symptoms.  Pt drives for a living.  No leg pain.

## 2017-09-14 ENCOUNTER — Other Ambulatory Visit: Payer: Self-pay

## 2017-09-14 ENCOUNTER — Emergency Department (HOSPITAL_BASED_OUTPATIENT_CLINIC_OR_DEPARTMENT_OTHER)
Admission: EM | Admit: 2017-09-14 | Discharge: 2017-09-14 | Disposition: A | Discharge status: Home / Self Care | Payer: Self-pay | Attending: Emergency Medicine | Admitting: Emergency Medicine

## 2017-09-14 ENCOUNTER — Encounter (HOSPITAL_BASED_OUTPATIENT_CLINIC_OR_DEPARTMENT_OTHER): Payer: Self-pay | Admitting: Emergency Medicine

## 2017-09-14 DIAGNOSIS — H60502 Unspecified acute noninfective otitis externa, left ear: Secondary | ICD-10-CM | POA: Insufficient documentation

## 2017-09-14 DIAGNOSIS — F17210 Nicotine dependence, cigarettes, uncomplicated: Secondary | ICD-10-CM | POA: Insufficient documentation

## 2017-09-14 MED ORDER — NAPROXEN 500 MG PO TABS: 500.0000 mg | ORAL_TABLET | Freq: Two times a day (BID) | ORAL | 0 refills | Status: AC

## 2017-09-14 MED ORDER — NAPROXEN 250 MG PO TABS
500.0000 mg | ORAL_TABLET | Freq: Once | ORAL | Status: AC
  Administered 2017-09-14: 500 mg via ORAL
  Filled 2017-09-14: qty 2

## 2017-09-14 MED ORDER — CIPROFLOXACIN-DEXAMETHASONE 0.3-0.1 % OT SUSP: 4.0000 [drp] | Freq: Two times a day (BID) | OTIC | 0 refills | Status: AC

## 2017-09-14 NOTE — ED Notes (Signed)
Pt discharged to home NAD.  

## 2017-09-14 NOTE — Discharge Instructions (Signed)
You were seen today for left ear pain.  You have evidence of infection of the ear canal.  Use drops for the infection.  Avoid submerging her head in water such as swimming.  You can take naproxen for pain.

## 2017-09-14 NOTE — ED Provider Notes (Signed)
[facility] EMERGENCY DEPARTMENT Provider Note   CSN: [id number] Arrival date & time: 09/14/17  0247     History   Chief Complaint Chief Complaint  Patient presents with  . Otalgia    HPI Danny Gordon is a 27 y.o. male.  HPI  This is a 27 year old male who presents with left ear pain.  Patient reports 4-day history of worsening left ear pain.  Currently he rates his pain 8 out of 10.  He has not taken anything for pain.  He denies any fevers.  He does report some congestion.  No coughs, sore throat, nausea, vomiting.  Patient does report that he uses Q-tips frequently to clean out his ear.  Reports normal hearing.  Past Medical History:  Diagnosis Date  . Exposure to TB   . Seizures [facility])     Patient Active Problem List   Diagnosis Date Noted  . Unspecified vitamin D deficiency 12/31/2012    Past Surgical History:  Procedure Laterality Date  . HERNIA REPAIR         Home Medications    Prior to Admission medications   Medication Sig Start Date End Date Taking? Authorizing Provider  ciprofloxacin-dexamethasone (CIPRODEX) OTIC suspension Place 4 drops into the left ear 2 (two) times daily. 09/14/17   Elic Vencill, Mayer Masker, MD  naproxen (NAPROSYN) 500 MG tablet Take 1 tablet (500 mg total) by mouth 2 (two) times daily. 09/14/17   Jorey Dollard, Mayer Masker, MD    Family History Family History  Problem Relation Age of Onset  . Diabetes Father     Social History Social History   Tobacco Use  . Smoking status: Current Every Day Smoker    Packs/day: 0.50    Types: Cigarettes  . Smokeless tobacco: Never Used  Substance Use Topics  . Alcohol use: Yes    Comment: occ  . Drug use: No     Allergies   Patient has no known allergies.   Review of Systems Review of Systems  Constitutional: Negative for fever.  HENT: Positive for congestion and ear pain. Negative for ear discharge.   Respiratory: Negative for cough and shortness of breath.     Cardiovascular: Negative for chest pain.  Gastrointestinal: Negative for abdominal pain, nausea and vomiting.  All other systems reviewed and are negative.    Physical Exam Updated Vital Signs BP (!) 145/94 (BP Location: Right Arm)   Pulse 87   Temp 97.7 F (36.5 C)   Resp 20   SpO2 100%   Physical Exam  Constitutional: He is oriented to person, place, and time. He appears well-developed and well-nourished.  Obese, no acute distress  HENT:  Head: Normocephalic and atraumatic.  Mouth/Throat: Oropharynx is clear and moist.  Focused examination of left ear reveals swelling to the external auditory canal with erythema, pain with pulling of the pinna, TM visualized and clear  Cardiovascular: Normal rate, regular rhythm and normal heart sounds.  No murmur heard. Pulmonary/Chest: Effort normal and breath sounds normal. No respiratory distress. He has no wheezes.  Neurological: He is alert and oriented to person, place, and time.  Skin: Skin is warm and dry.  Psychiatric: He has a normal mood and affect.  Nursing note and vitals reviewed.    ED Treatments / Results  Labs (all labs ordered are listed, but only abnormal results are displayed) Labs Reviewed - No data to display  EKG  EKG Interpretation None       Radiology No results found.  Procedures Procedures (including critical care time)  Medications Ordered in ED Medications  naproxen (NAPROSYN) tablet 500 mg (not administered)     Initial Impression / Assessment and Plan / ED Course  I have reviewed the triage vital signs and the nursing notes.  Pertinent labs & imaging results that were available during my care of the patient were reviewed by me and considered in my medical decision making (see chart for details).     Patient presents with left ear pain.  Exam is consistent with otitis externa.  Patient was given naproxen for pain.  Will discharge with Ciprodex drops and naproxen for pain.   After  history, exam, and medical workup I feel the patient has been appropriately medically screened and is safe for discharge home. Pertinent diagnoses were discussed with the patient. Patient was given return precautions.   Final Clinical Impressions(s) / ED Diagnoses   Final diagnoses:  Acute otitis externa of left ear, unspecified type    ED Discharge Orders        Ordered    ciprofloxacin-dexamethasone (CIPRODEX) OTIC suspension  2 times daily     09/14/17 0324    naproxen (NAPROSYN) 500 MG tablet  2 times daily     09/14/17 0324       Kolson Chovanec, Mayer Maskerourtney F, MD 09/14/17 [phone]

## 2017-09-14 NOTE — ED Triage Notes (Signed)
Pt presents with c/o left ear pain for 4 days.

## 2018-08-22 ENCOUNTER — Emergency Department (HOSPITAL_BASED_OUTPATIENT_CLINIC_OR_DEPARTMENT_OTHER): Payer: BLUE CROSS/BLUE SHIELD

## 2018-08-22 ENCOUNTER — Encounter (HOSPITAL_BASED_OUTPATIENT_CLINIC_OR_DEPARTMENT_OTHER): Payer: Self-pay | Admitting: *Deleted

## 2018-08-22 ENCOUNTER — Emergency Department (HOSPITAL_BASED_OUTPATIENT_CLINIC_OR_DEPARTMENT_OTHER)
Admission: EM | Admit: 2018-08-22 | Discharge: 2018-08-23 | Disposition: A | Discharge status: Home / Self Care | Payer: BLUE CROSS/BLUE SHIELD | Attending: Emergency Medicine | Admitting: Emergency Medicine

## 2018-08-22 ENCOUNTER — Other Ambulatory Visit: Payer: Self-pay

## 2018-08-22 DIAGNOSIS — Z79899 Other long term (current) drug therapy: Secondary | ICD-10-CM | POA: Insufficient documentation

## 2018-08-22 DIAGNOSIS — R072 Precordial pain: Secondary | ICD-10-CM | POA: Diagnosis not present

## 2018-08-22 DIAGNOSIS — R002 Palpitations: Secondary | ICD-10-CM | POA: Diagnosis not present

## 2018-08-22 DIAGNOSIS — F17210 Nicotine dependence, cigarettes, uncomplicated: Secondary | ICD-10-CM | POA: Insufficient documentation

## 2018-08-22 DIAGNOSIS — R0789 Other chest pain: Secondary | ICD-10-CM | POA: Diagnosis present

## 2018-08-22 LAB — CBC
HEMATOCRIT: 42.5 % (ref 39.0–52.0)
HEMOGLOBIN: 13.5 g/dL (ref 13.0–17.0)
MCH: 27.7 pg (ref 26.0–34.0)
MCHC: 31.8 g/dL (ref 30.0–36.0)
MCV: 87.3 fL (ref 80.0–100.0)
Platelets: 325 10*3/uL (ref 150–400)
RBC: 4.87 MIL/uL (ref 4.22–5.81)
RDW: 13.6 % (ref 11.5–15.5)
WBC: 8.4 10*3/uL (ref 4.0–10.5)
nRBC: 0 % (ref 0.0–0.2)

## 2018-08-22 LAB — BASIC METABOLIC PANEL
Anion gap: 7 (ref 5–15)
BUN: 14 mg/dL (ref 6–20)
CHLORIDE: 104 mmol/L (ref 98–111)
CO2: 26 mmol/L (ref 22–32)
Calcium: 8.8 mg/dL — ABNORMAL LOW (ref 8.9–10.3)
Creatinine, Ser: 1.02 mg/dL (ref 0.61–1.24)
GFR calc Af Amer: >60 mL/min (ref >60)
Glucose, Bld: 123 mg/dL — ABNORMAL HIGH (ref 70–99)
POTASSIUM: 3.7 mmol/L (ref 3.5–5.1)
SODIUM: 137 mmol/L (ref 135–145)

## 2018-08-22 LAB — TROPONIN I: Troponin I: <0.03 ng/mL (ref <0.03)

## 2018-08-22 MED ORDER — SODIUM CHLORIDE 0.9% FLUSH
3.0000 mL | Freq: Once | INTRAVENOUS | Status: DC
  Filled 2018-08-22: qty 3

## 2018-08-22 NOTE — ED Triage Notes (Signed)
Chest pain with radiation in his right arm. He feels like if he could "pop" his chest it would help. He has a fluttering feeling off and on that started yesterday.

## 2018-08-22 NOTE — ED Notes (Signed)
States he might not stay since his EKG is OK. Encourage to stay. Lab test explained to pt.

## 2018-08-23 NOTE — ED Provider Notes (Signed)
[facility] EMERGENCY DEPARTMENT Provider Note   CSN: [id number] Arrival date & time: 08/22/18  2126     History   Chief Complaint Chief Complaint  Patient presents with  . Chest Pain    HPI Danny Gordon is a 28 y.o. male.  The history is provided by the patient.  Chest Pain  Pain location:  R chest Pain quality: pressure   Pain severity:  Moderate Onset quality:  Gradual Timing:  Constant Progression:  Improving Chronicity:  New Relieved by:  Nothing Worsened by:  Nothing Associated symptoms: palpitations and shortness of breath   Associated symptoms: no fever, no syncope and no vomiting   Patient reports about 2 to 3 days ago he had an episode of palpitations.  This resolved.  And several hours ago began having right arm pain, it is unclear what caused this pain.  He then googled his symptoms and told him he may be having a heart attack.  He then proceeded after reading google he began having  chest pressure.  He felt like he needed to pop his chest.  He reported short of breath.  He is now feeling improved.   No hemoptysis.  No previous history of PE.  No travel. He just quit smoking Past Medical History:  Diagnosis Date  . Exposure to TB   . Seizures [facility])     Patient Active Problem List   Diagnosis Date Noted  . Unspecified vitamin D deficiency 12/31/2012    Past Surgical History:  Procedure Laterality Date  . HERNIA REPAIR          Home Medications    Prior to Admission medications   Medication Sig Start Date End Date Taking? Authorizing Provider  ciprofloxacin-dexamethasone (CIPRODEX) OTIC suspension Place 4 drops into the left ear 2 (two) times daily. 09/14/17   Horton, Mayer Maskerourtney F, MD  naproxen (NAPROSYN) 500 MG tablet Take 1 tablet (500 mg total) by mouth 2 (two) times daily. 09/14/17   Horton, Mayer Maskerourtney F, MD    Family History Family History  Problem Relation Age of Onset  . Diabetes Father     Social History Social History     Tobacco Use  . Smoking status: Current Every Day Smoker    Packs/day: 0.50    Types: Cigarettes  . Smokeless tobacco: Never Used  Substance Use Topics  . Alcohol use: Yes    Comment: occ  . Drug use: No     Allergies   Patient has no known allergies.   Review of Systems Review of Systems  Constitutional: Negative for fever.  Respiratory: Positive for shortness of breath.   Cardiovascular: Positive for chest pain and palpitations. Negative for syncope.  Gastrointestinal: Negative for vomiting.  Neurological: Negative for syncope.  All other systems reviewed and are negative.    Physical Exam Updated Vital Signs BP (!) 145/99 (BP Location: Left Arm)   Pulse 82   Temp 98.2 F (36.8 C) (Oral)   Resp 20   Ht 1.829 m (6')   Wt (!) 172.4 kg   SpO2 100%   BMI 51.54 kg/m   Physical Exam  CONSTITUTIONAL: Well developed/well nourished HEAD: Normocephalic/atraumatic EYES: EOMI ENMT: Mucous membranes moist NECK: supple no meningeal signs SPINE/BACK:entire spine nontender CV: S1/S2 noted, no murmurs/rubs/gallops noted LUNGS: Lungs are clear to auscultation bilaterally, no apparent distress Chest-no chest wall tenderness, no crepitus ABDOMEN: soft, nontender, no rebound or guarding, bowel sounds noted throughout abdomen GU:no cva tenderness NEURO: Pt is awake/alert/appropriate, moves  all extremitiesx4.  No facial droop.   EXTREMITIES: pulses normal/equalx4, full ROM, no lower extremity edema  no arm edema.  No arm erythema.  Arms appear symmetric SKIN: warm, color normal PSYCH: no abnormalities of mood noted, alert and oriented to situation  ED Treatments / Results  Labs (all labs ordered are listed, but only abnormal results are displayed) Labs Reviewed  BASIC METABOLIC PANEL - Abnormal; Notable for the following components:      Result Value   Glucose, Bld 123 (*)    Calcium 8.8 (*)    All other components within normal limits  CBC  TROPONIN I     EKG EKG Interpretation  Date/Time:  Monday August 22 2018 21:34:09 EST Ventricular Rate:  83 PR Interval:  134 QRS Duration: 88 QT Interval:  394 QTC Calculation: 462 R Axis:   37 Text Interpretation:  Normal sinus rhythm with sinus arrhythmia Nonspecific T wave abnormality Abnormal ECG Interpretation limited secondary to artifact Confirmed by Zadie Rhine ([id number]) on 08/23/2018 12:11:37 AM   Radiology Dg Chest 2 View  Result Date: 08/22/2018 CLINICAL DATA:  Chest pain. Mid chest pain radiating to right arm. EXAM: CHEST - 2 VIEW COMPARISON:  05/13/2016 FINDINGS: The cardiomediastinal contours are normal. The lungs are clear. Pulmonary vasculature is normal. No consolidation, pleural effusion, or pneumothorax. No acute osseous abnormalities are seen. IMPRESSION: No acute chest findings. Electronically Signed   By: Narda Rutherford M.D.   On: 08/22/2018 21:56    Procedures Procedures    Medications Ordered in ED Medications  sodium chloride flush (NS) 0.9 % injection 3 mL (3 mLs Intravenous Not Given 08/23/18 0010)     Initial Impression / Assessment and Plan / ED Course  I have reviewed the triage vital signs and the nursing notes.  Pertinent labs & imaging results that were available during my care of the patient were reviewed by me and considered in my medical decision making (see chart for details).     Patient well-appearing.  Reports palpitations a few days ago, then began having right arm pain then he read about his symptoms and suggested he may be having a heart attack.  At this time I have low suspicion for ACS/PE/dissection.  He is currently PERC negative. Advise follow-up as an outpatient.  He may need outpatient Holter monitoring for his palpitations EKG appears unchanged Final Clinical Impressions(s) / ED Diagnoses   Final diagnoses:  Precordial pain  Palpitations    ED Discharge Orders    None       Zadie Rhine, MD 08/23/18 [phone]

## 2018-08-23 NOTE — Discharge Instructions (Addendum)

## 2020-10-07 ENCOUNTER — Encounter (HOSPITAL_BASED_OUTPATIENT_CLINIC_OR_DEPARTMENT_OTHER): Payer: Self-pay

## 2020-10-07 ENCOUNTER — Other Ambulatory Visit: Payer: Self-pay

## 2020-10-07 ENCOUNTER — Emergency Department (HOSPITAL_BASED_OUTPATIENT_CLINIC_OR_DEPARTMENT_OTHER)
Admission: EM | Admit: 2020-10-07 | Discharge: 2020-10-07 | Disposition: A | Discharge status: Home / Self Care | Payer: BLUE CROSS/BLUE SHIELD | Attending: Emergency Medicine | Admitting: Emergency Medicine

## 2020-10-07 DIAGNOSIS — Z5321 Procedure and treatment not carried out due to patient leaving prior to being seen by health care provider: Secondary | ICD-10-CM | POA: Insufficient documentation

## 2020-10-07 DIAGNOSIS — R041 Hemorrhage from throat: Secondary | ICD-10-CM | POA: Insufficient documentation

## 2020-10-07 NOTE — ED Triage Notes (Signed)
Pt c/o "blood in my throat"-first noticed yesterday-denies pain-denies cough/fever-NAD-steady gait

## 2021-06-15 ENCOUNTER — Emergency Department (HOSPITAL_BASED_OUTPATIENT_CLINIC_OR_DEPARTMENT_OTHER)
Admission: EM | Admit: 2021-06-15 | Discharge: 2021-06-16 | Disposition: A | Discharge status: Home / Self Care | Payer: Self-pay | Attending: Emergency Medicine | Admitting: Emergency Medicine

## 2021-06-15 ENCOUNTER — Emergency Department (HOSPITAL_BASED_OUTPATIENT_CLINIC_OR_DEPARTMENT_OTHER): Payer: Self-pay

## 2021-06-15 ENCOUNTER — Other Ambulatory Visit: Payer: Self-pay

## 2021-06-15 ENCOUNTER — Encounter (HOSPITAL_BASED_OUTPATIENT_CLINIC_OR_DEPARTMENT_OTHER): Payer: Self-pay | Admitting: Emergency Medicine

## 2021-06-15 DIAGNOSIS — M25561 Pain in right knee: Secondary | ICD-10-CM

## 2021-06-15 DIAGNOSIS — R519 Headache, unspecified: Secondary | ICD-10-CM

## 2021-06-15 DIAGNOSIS — F17210 Nicotine dependence, cigarettes, uncomplicated: Secondary | ICD-10-CM | POA: Insufficient documentation

## 2021-06-15 NOTE — ED Triage Notes (Signed)
Pt reports injury to RT knee x 1 month ago; pain is worse now

## 2021-06-16 MED ORDER — DICLOFENAC SODIUM 1 % EX GEL: 2.0000 g | Freq: Four times a day (QID) | CUTANEOUS | 0 refills | Status: AC | PRN

## 2021-06-16 MED ORDER — DOXYCYCLINE HYCLATE 100 MG PO CAPS: 100.0000 mg | ORAL_CAPSULE | Freq: Two times a day (BID) | ORAL | 0 refills | Status: AC

## 2021-06-16 NOTE — Discharge Instructions (Signed)
You were seen in the emergency room today with knee pain.  I would like for you to take Aleve (Naproxen) twice daily by following dosing instructions on the box.  This medication is available over-the-counter at your pharmacy.  I am calling in a prescription for a gel that you can rub on your knee to help alleviate pain.  You can try using a knee sleeve/compression device to provide some support.  I have listed the name of a sports medicine doctor for follow-up.  Please call the office to make a follow-up appointment.   In terms of the area on your scalp.  I am starting on antibiotics for the next week and will have you apply warm compress to the area 2-3 times daily.  If you develop fever or drainage from the wound please return to the emergency department for reevaluation.

## 2021-06-16 NOTE — ED Notes (Signed)
ED Provider at bedside. 

## 2021-06-16 NOTE — ED Provider Notes (Signed)
Emergency Department Provider Note   I have reviewed the triage vital signs and the nursing notes.   HISTORY  Chief Complaint Knee Pain   HPI HENRICK Gordon is a 30 y.o. male with past history reviewed below presents emergency department for evaluation evaluation of right knee pain over the past month as well as a painful area to the back of the scalp.  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  He noticed some soreness and pain in the right knee which is progressively worsened over the past month.  He has not tried any over-the-counter medications or compression.  No knee redness, swelling, fevers.  Denies any known injury.  No ankle or hip pain.  No falls.  Patient also notes painful area to the posterior, right scalp.  He has noticed knot in this area which he thought was a small "boil."  He states this was never specifically treated with incision/drainage or antibiotics.  He states the size is decreased but that he still feels some mild tenderness and swelling in that area.  Pain is worse with turning his head to the right or touching the area. No fever.   Past Medical History:  Diagnosis Date   Exposure to TB    Seizures [facility])     Patient Active Problem List   Diagnosis Date Noted   Unspecified vitamin D deficiency 12/31/2012    Past Surgical History:  Procedure Laterality Date   HERNIA REPAIR      Allergies Patient has no known allergies.  Family History  Problem Relation Age of Onset   Diabetes Father     Social History Social History   Tobacco Use   Smoking status: Every Day    Packs/day: 0.50    Types: Cigarettes   Smokeless tobacco: Never  Vaping Use   Vaping Use: Never used  Substance Use Topics   Alcohol use: Yes    Comment: occ   Drug use: No    Review of Systems  Constitutional: No fever/chills Eyes: No visual changes. ENT: No sore throat. Cardiovascular: Denies chest pain. Respiratory:  Denies shortness of breath. Gastrointestinal: No abdominal pain.  No nausea, no vomiting.  No diarrhea.  No constipation. Genitourinary: Negative for dysuria. Musculoskeletal: Negative for back pain. Positive right knee pain.  Skin: Negative for rash. Neurological: Negative for focal weakness or numbness. Positive posterior HA.   10-point ROS otherwise negative.  ____________________________________________   PHYSICAL EXAM:  VITAL SIGNS: ED Triage Vitals  Enc Vitals Group     BP 06/15/21 2217 (!) 173/104     Pulse Rate 06/15/21 2217 99     Resp 06/15/21 2217 18     Temp 06/15/21 2217 98.3 F (36.8 C)     Temp Source 06/15/21 2217 Oral     SpO2 06/15/21 2217 98 %     Weight 06/15/21 2216 (!) 400 lb (181.4 kg)     Height 06/15/21 2216 6' (1.829 m)   Constitutional: Alert and oriented. Well appearing and in no acute distress. Eyes: Conjunctivae are normal.  Head: Atraumatic. 1 cm nodular area to the right occipital scalp. No fluctuance, erythema, warmth, or drainage.  Nose: No congestion/rhinnorhea. Mouth/Throat: Mucous membranes are moist.   Neck: No stridor.  Cardiovascular: Good peripheral circulation.   Respiratory: Normal respiratory effort.   Gastrointestinal: No distention.  Musculoskeletal: No lower extremity tenderness nor edema.  Mild tenderness to the inferior aspect of the right knee.  No  patellar tenderness.  No joint laxity.  No large effusion.  No ankle tenderness.  Neurologic:  Normal speech and language. No gross focal neurologic deficits are appreciated.  Skin:  Skin is warm, dry and intact. No rash noted.  ____________________________________________  RADIOLOGY  DG Knee Complete 4 Views Right  Result Date: 06/15/2021 CLINICAL DATA:  Right knee pain EXAM: RIGHT KNEE - COMPLETE 4+ VIEW COMPARISON:  None. FINDINGS: No evidence of fracture, dislocation, or joint effusion. No evidence of arthropathy or other focal bone abnormality. Soft tissues are  unremarkable. IMPRESSION: Negative. Electronically Signed   By: Deatra Robinson M.D.   On: 06/15/2021 23:14    ____________________________________________   PROCEDURES  Procedure(s) performed:   Procedures  None  ____________________________________________   INITIAL IMPRESSION / ASSESSMENT AND PLAN / ED COURSE  Pertinent labs & imaging results that were available during my care of the patient were reviewed by me and considered in my medical decision making (see chart for details).   Patient presents to the emergency department with right knee pain as well as painful area to the posterior scalp.  The posterior scalp lesion does not appear fluctuant or in need of incision/drainage.  Advised that we could consider short course of antibiotics in case this is some area of focal cellulitis and apply warm compress as needed.   In terms of the knee this does not appear to be a septic joint.  My suspicion for fracture is very low.  His plain film ordered from triage shows no obvious arthropathy or other focal bony abnormality.  Plan for RICE and sports med/PCP follow up for this. Exam and history not consistent with DVT or limb ischemia.    ____________________________________________  FINAL CLINICAL IMPRESSION(S) / ED DIAGNOSES  Final diagnoses:  Acute pain of right knee  Scalp pain     NEW OUTPATIENT MEDICATIONS STARTED DURING THIS VISIT:  Discharge Medication List as of 06/16/2021 12:21 AM     START taking these medications   Details  diclofenac Sodium (VOLTAREN) 1 % GEL Apply 2 g topically 4 (four) times daily as needed., Starting Mon 06/16/2021, Normal    doxycycline (VIBRAMYCIN) 100 MG capsule Take 1 capsule (100 mg total) by mouth 2 (two) times daily for 7 days., Starting Mon 06/16/2021, Until Mon 06/23/2021, Normal        Note:  This document was prepared using Dragon voice recognition software and may include unintentional dictation errors.  Alona Bene, MD,  [facility] Emergency Medicine    Thelia Tanksley, Arlyss Repress, MD 06/16/21 [phone]

## 2021-06-25 ENCOUNTER — Ambulatory Visit: Payer: Self-pay | Admitting: Family Medicine

## 2022-07-22 ENCOUNTER — Other Ambulatory Visit: Payer: Self-pay

## 2022-07-22 ENCOUNTER — Emergency Department (HOSPITAL_BASED_OUTPATIENT_CLINIC_OR_DEPARTMENT_OTHER)
Admission: EM | Admit: 2022-07-22 | Discharge: 2022-07-22 | Discharge status: Against Medical Advice | Payer: Self-pay | Attending: Emergency Medicine | Admitting: Emergency Medicine

## 2022-07-22 DIAGNOSIS — Z5321 Procedure and treatment not carried out due to patient leaving prior to being seen by health care provider: Secondary | ICD-10-CM | POA: Insufficient documentation

## 2022-07-22 DIAGNOSIS — Z79899 Other long term (current) drug therapy: Secondary | ICD-10-CM | POA: Insufficient documentation

## 2022-07-22 DIAGNOSIS — I10 Essential (primary) hypertension: Secondary | ICD-10-CM | POA: Insufficient documentation

## 2022-07-22 DIAGNOSIS — Z760 Encounter for issue of repeat prescription: Secondary | ICD-10-CM | POA: Insufficient documentation

## 2022-07-22 LAB — CBG MONITORING, ED: Glucose-Capillary: 311 mg/dL — ABNORMAL HIGH (ref 70–99)

## 2022-07-22 NOTE — ED Triage Notes (Signed)
Pt states ran out of HCTZ 1 week ago and unable to get refill at pharmacy. Pt complains of headache.

## 2022-07-22 NOTE — ED Notes (Signed)
Pt requested to have blood sugar checked.

## 2022-07-22 NOTE — ED Notes (Signed)
Informed by registration that patient left.

## 2022-11-11 ENCOUNTER — Encounter: Payer: Self-pay | Admitting: *Deleted

## 2024-07-20 ENCOUNTER — Emergency Department (HOSPITAL_BASED_OUTPATIENT_CLINIC_OR_DEPARTMENT_OTHER)
Admission: EM | Admit: 2024-07-20 | Discharge: 2024-07-20 | Disposition: A | Discharge status: Home / Self Care | Source: Ambulatory Visit | Attending: Emergency Medicine | Admitting: Emergency Medicine

## 2024-07-20 ENCOUNTER — Other Ambulatory Visit: Payer: Self-pay

## 2024-07-20 ENCOUNTER — Encounter (HOSPITAL_BASED_OUTPATIENT_CLINIC_OR_DEPARTMENT_OTHER): Payer: Self-pay | Admitting: Emergency Medicine

## 2024-07-20 ENCOUNTER — Emergency Department (HOSPITAL_BASED_OUTPATIENT_CLINIC_OR_DEPARTMENT_OTHER)

## 2024-07-20 DIAGNOSIS — E860 Dehydration: Secondary | ICD-10-CM | POA: Diagnosis not present

## 2024-07-20 DIAGNOSIS — R42 Dizziness and giddiness: Secondary | ICD-10-CM | POA: Diagnosis present

## 2024-07-20 DIAGNOSIS — E1165 Type 2 diabetes mellitus with hyperglycemia: Secondary | ICD-10-CM | POA: Diagnosis not present

## 2024-07-20 DIAGNOSIS — Z7984 Long term (current) use of oral hypoglycemic drugs: Secondary | ICD-10-CM | POA: Diagnosis not present

## 2024-07-20 DIAGNOSIS — R739 Hyperglycemia, unspecified: Secondary | ICD-10-CM

## 2024-07-20 HISTORY — DX: Type 2 diabetes mellitus without complications: E11.9

## 2024-07-20 LAB — CBC
HCT: 41.3 % (ref 39.0–52.0)
Hemoglobin: 14.3 g/dL (ref 13.0–17.0)
MCH: 27.9 pg (ref 26.0–34.0)
MCHC: 34.6 g/dL (ref 30.0–36.0)
MCV: 80.5 fL (ref 80.0–100.0)
Platelets: 310 K/uL (ref 150–400)
RBC: 5.13 MIL/uL (ref 4.22–5.81)
RDW: 12.9 % (ref 11.5–15.5)
WBC: 7.5 K/uL (ref 4.0–10.5)
nRBC: 0 % (ref 0.0–0.2)

## 2024-07-20 LAB — I-STAT VENOUS BLOOD GAS, ED
Acid-Base Excess: 6 mmol/L — ABNORMAL HIGH (ref 0.0–2.0)
Bicarbonate: 30.3 mmol/L — ABNORMAL HIGH (ref 20.0–28.0)
Calcium, Ion: 1.11 mmol/L — ABNORMAL LOW (ref 1.15–1.40)
HCT: 42 % (ref 39.0–52.0)
Hemoglobin: 14.3 g/dL (ref 13.0–17.0)
O2 Saturation: 81 %
Patient temperature: 98.3
Potassium: 3.2 mmol/L — ABNORMAL LOW (ref 3.5–5.1)
Sodium: 132 mmol/L — ABNORMAL LOW (ref 135–145)
TCO2: 32 mmol/L (ref 22–32)
pCO2, Ven: 41.6 mmHg — ABNORMAL LOW (ref 44–60)
pH, Ven: 7.47 — ABNORMAL HIGH (ref 7.25–7.43)
pO2, Ven: 43 mmHg (ref 32–45)

## 2024-07-20 LAB — BASIC METABOLIC PANEL WITH GFR
Anion gap: 15 (ref 5–15)
BUN: 14 mg/dL (ref 6–20)
CO2: 25 mmol/L (ref 22–32)
Calcium: 9.4 mg/dL (ref 8.9–10.3)
Chloride: 94 mmol/L — ABNORMAL LOW (ref 98–111)
Creatinine, Ser: 0.96 mg/dL (ref 0.61–1.24)
GFR, Estimated: >60 mL/min
Glucose, Bld: 199 mg/dL — ABNORMAL HIGH (ref 70–99)
Potassium: 3.4 mmol/L — ABNORMAL LOW (ref 3.5–5.1)
Sodium: 134 mmol/L — ABNORMAL LOW (ref 135–145)

## 2024-07-20 LAB — CBG MONITORING, ED: Glucose-Capillary: 223 mg/dL — ABNORMAL HIGH (ref 70–99)

## 2024-07-20 LAB — RESP PANEL BY RT-PCR (RSV, FLU A&B, COVID)  RVPGX2
Influenza A by PCR: NEGATIVE
Influenza B by PCR: NEGATIVE
Resp Syncytial Virus by PCR: NEGATIVE
SARS Coronavirus 2 by RT PCR: NEGATIVE

## 2024-07-20 MED ORDER — LACTATED RINGERS IV BOLUS
1000.0000 mL | Freq: Once | INTRAVENOUS | Status: AC
  Administered 2024-07-20: 1000 mL via INTRAVENOUS

## 2024-07-20 MED ORDER — METOCLOPRAMIDE HCL 5 MG/ML IJ SOLN
10.0000 mg | Freq: Once | INTRAMUSCULAR | Status: AC
  Administered 2024-07-20: 10 mg via INTRAVENOUS
  Filled 2024-07-20: qty 2

## 2024-07-20 NOTE — Discharge Instructions (Addendum)
 The lab work today looks okay.  You are a little bit dehydrated but no signs of DKA.  Make sure you are drinking plenty of fluids.  Your EKG and x-ray also looked okay today but if you start feeling worse with continued vomiting, high fever, chest pain or passing out return to the emergency room.

## 2024-07-20 NOTE — ED Provider Notes (Signed)
 " Danny Gordon   CSN: [id number] Arrival date & time: 07/20/24  1948     Patient presents with: Dizziness   Danny Gordon is a 33 y.o. male.   Patient is a 33 year old male with a history of diabetes who is taking his metformin intermittently, hypertension on amlodipine and hydrochlorothiazide who is presenting today with several complaints.  He reports in the last 2 to 3 days he has been feeling very fatigued, in the last day he has had nausea and 1 episode of emesis and just feeling off.  He reports some mild dizziness.  On occasion he will have shortness of breath but denies any at this time.  He reports feeling queasy but denies any abdominal pain or chest pain.  He has not had cough, congestion or flulike symptoms.  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  He reports good compliance with his antihypertensives.  He does complain of polydipsia but has not noticed significant polyuria.  No visual changes or significant headaches.  The history is provided by the patient and medical records.  Dizziness      Prior to Admission medications  Medication Sig Start Date End Date Taking? Authorizing Provider  ciprofloxacin -dexamethasone  (CIPRODEX ) OTIC suspension Place 4 drops into the left ear 2 (two) times daily. 09/14/17   Horton, Charmaine FALCON, MD  diclofenac  Sodium (VOLTAREN ) 1 % GEL Apply 2 g topically 4 (four) times daily as needed. 06/16/21   Long, Joshua G, MD  naproxen  (NAPROSYN ) 500 MG tablet Take 1 tablet (500 mg total) by mouth 2 (two) times daily. 09/14/17   Horton, Charmaine FALCON, MD    Allergies: Patient has no known allergies.    Review of Systems  Neurological:  Positive for dizziness.    Updated Vital Signs BP 134/80   Pulse 92   Temp 98.2 F (36.8 C)   Resp 18   Ht  6' (1.829 m)   Wt (!) 172.4 kg   SpO2 97%   BMI 51.54 kg/m   Physical Exam Vitals and nursing Gordon reviewed.  Constitutional:      General: He is not in acute distress.    Appearance: He is well-developed.  HENT:     Head: Normocephalic and atraumatic.     Mouth/Throat:     Mouth: Mucous membranes are dry.  Eyes:     Conjunctiva/sclera: Conjunctivae normal.     Pupils: Pupils are equal, round, and reactive to light.  Cardiovascular:     Rate and Rhythm: Normal rate and regular rhythm.     Pulses: Normal pulses.     Heart sounds: No murmur heard. Pulmonary:     Effort: Pulmonary effort is normal. No respiratory distress.     Breath sounds: Normal breath sounds. No wheezing or rales.  Abdominal:     General: There is no distension.     Palpations: Abdomen is soft.     Tenderness: There is no abdominal tenderness. There is no guarding or rebound.  Musculoskeletal:        General: No tenderness. Normal range of motion.     Cervical back: Normal range of motion and neck supple.  Skin:    General: Skin is warm and dry.     Findings: No erythema or rash.  Neurological:  Mental Status: He is alert and oriented to person, place, and time. Mental status is at baseline.  Psychiatric:        Behavior: Behavior normal.     (all labs ordered are listed, but only abnormal results are displayed) Labs Reviewed  BASIC METABOLIC PANEL WITH GFR - Abnormal; Notable for the following components:      Result Value   Sodium 134 (*)    Potassium 3.4 (*)    Chloride 94 (*)    Glucose, Bld 199 (*)    All other components within normal limits  CBG MONITORING, ED - Abnormal; Notable for the following components:   Glucose-Capillary 223 (*)    All other components within normal limits  I-STAT VENOUS BLOOD GAS, ED - Abnormal; Notable for the following components:   pH, Ven 7.470 (*)    pCO2, Ven 41.6 (*)    Bicarbonate 30.3 (*)    Acid-Base Excess 6.0 (*)    Sodium 132 (*)     Potassium 3.2 (*)    Calcium, Ion 1.11 (*)    All other components within normal limits  RESP PANEL BY RT-PCR (RSV, FLU A&B, COVID)  RVPGX2  CBC  URINALYSIS, ROUTINE W REFLEX MICROSCOPIC  CBG MONITORING, ED    EKG: EKG Interpretation Date/Time:  Thursday July 20 2024 20:45:23 EST Ventricular Rate:  93 PR Interval:  143 QRS Duration:  89 QT Interval:  366 QTC Calculation: 456 R Axis:   63  Text Interpretation: Sinus rhythm No significant change since last tracing Confirmed by Doretha Folks ([id number]) on 07/20/2024 8:50:47 PM  Radiology: [facility] Chest Port 1 View Result Date: 07/20/2024 EXAM: 1 VIEW(S) XRAY OF THE CHEST 07/20/2024 08:42:00 PM COMPARISON: 11 / 11 / 23 CLINICAL HISTORY: sob FINDINGS: LUNGS AND PLEURA: No focal pulmonary opacity. No pleural effusion. No pneumothorax. HEART AND MEDIASTINUM: No acute abnormality of the cardiac and mediastinal silhouettes. BONES AND SOFT TISSUES: No acute osseous abnormality. IMPRESSION: 1. No acute cardiopulmonary process. Electronically signed by: Norman Gatlin MD 07/20/2024 08:47 PM EST RP Workstation: [id number]     Procedures   Medications Ordered in the ED  lactated ringers  bolus 1,000 mL (0 mLs Intravenous Stopped 07/20/24 2259)  metoCLOPramide  (REGLAN ) injection 10 mg (10 mg Intravenous Given 07/20/24 2123)                                    Medical Decision Making Amount and/or Complexity of Data Reviewed Labs: ordered. Decision-making details documented in ED Course. Radiology: ordered and independent interpretation performed. Decision-making details documented in ED Course. ECG/medicine tests: ordered and independent interpretation performed. Decision-making details documented in ED Course.  Risk Prescription drug management.   Pt with multiple medical problems and comorbidities and presenting today with a complaint that caries a high risk for morbidity and mortality.  Here today with the above complaints.  Concern  for hyperglycemia, electrolyte abnormalities, early DKA.  Patient has no focal abdominal tenderness to suggest an acute abdominal process such as cholelithiasis, hepatitis, pancreatitis, appendicitis.  Low suspicion for acute urinary pathology.  Patient is not having upper respiratory symptoms concerning for URI, pneumonia.  Will rule out cardiac cause. Patient given IV fluids. I independently interpreted patient's labs and EKG.  EKG shows a normal sinus rhythm without acute findings.  CBC within normal limits, blood sugar at 223.  Viral swab is negative, CMP without acute findings with an anion gap of  15 normal creatinine, VBG without signs of DKA.  All the findings were discussed with the patient.  Repeat blood pressure here is normal.  He has had no vomiting.  He will follow-up with his doctor about possibly changing his metformin to a more tolerable diabetic medication. At this time patient is stable for discharge home.      Final diagnoses:  Hyperglycemia  Dehydration    ED Discharge Orders     None          Doretha Folks, MD 07/20/24 2335  "

## 2024-07-20 NOTE — ED Triage Notes (Signed)
 Pt c/o nausea, fatigue (keeps falling asleep), dizziness x 1 day.  Reports elevated  blood sugar today, 480.    Reports noncompliance with metformin.

## 2024-07-20 NOTE — ED Notes (Signed)
Pt reports nausea has improved.

## 2024-07-20 NOTE — ED Notes (Signed)
 Pt is here for flu-like symptoms, dizziness and vomiting.   Pt states his BS has been running high 200-400 range  He takes metformin and has not taken it in approx 6 months.   No c/o pain

## 2024-08-08 ENCOUNTER — Emergency Department (HOSPITAL_BASED_OUTPATIENT_CLINIC_OR_DEPARTMENT_OTHER)
Admission: EM | Admit: 2024-08-08 | Discharge: 2024-08-08 | Disposition: A | Discharge status: Home / Self Care | Payer: Self-pay | Attending: Emergency Medicine | Admitting: Emergency Medicine

## 2024-08-08 ENCOUNTER — Other Ambulatory Visit: Payer: Self-pay

## 2024-08-08 ENCOUNTER — Encounter (HOSPITAL_BASED_OUTPATIENT_CLINIC_OR_DEPARTMENT_OTHER): Payer: Self-pay

## 2024-08-08 ENCOUNTER — Emergency Department (HOSPITAL_BASED_OUTPATIENT_CLINIC_OR_DEPARTMENT_OTHER): Payer: Self-pay

## 2024-08-08 DIAGNOSIS — R739 Hyperglycemia, unspecified: Secondary | ICD-10-CM

## 2024-08-08 DIAGNOSIS — E1165 Type 2 diabetes mellitus with hyperglycemia: Secondary | ICD-10-CM | POA: Insufficient documentation

## 2024-08-08 DIAGNOSIS — R002 Palpitations: Secondary | ICD-10-CM | POA: Insufficient documentation

## 2024-08-08 DIAGNOSIS — Z7984 Long term (current) use of oral hypoglycemic drugs: Secondary | ICD-10-CM | POA: Insufficient documentation

## 2024-08-08 LAB — HEPATIC FUNCTION PANEL
ALT: 40 U/L (ref 0–44)
AST: 27 U/L (ref 15–41)
Albumin: 4.5 g/dL (ref 3.5–5.0)
Alkaline Phosphatase: 72 U/L (ref 38–126)
Bilirubin, Direct: 0.2 mg/dL (ref 0.0–0.2)
Indirect Bilirubin: 0.3 mg/dL (ref 0.3–0.9)
Total Bilirubin: 0.5 mg/dL (ref 0.0–1.2)
Total Protein: 7 g/dL (ref 6.5–8.1)

## 2024-08-08 LAB — LIPASE, BLOOD: Lipase: 42 U/L (ref 11–51)

## 2024-08-08 LAB — CBC
HCT: 41.5 % (ref 39.0–52.0)
Hemoglobin: 14.5 g/dL (ref 13.0–17.0)
MCH: 28.4 pg (ref 26.0–34.0)
MCHC: 34.9 g/dL (ref 30.0–36.0)
MCV: 81.4 fL (ref 80.0–100.0)
Platelets: 289 K/uL (ref 150–400)
RBC: 5.1 MIL/uL (ref 4.22–5.81)
RDW: 13.1 % (ref 11.5–15.5)
WBC: 7.2 K/uL (ref 4.0–10.5)
nRBC: 0 % (ref 0.0–0.2)

## 2024-08-08 LAB — BASIC METABOLIC PANEL WITH GFR
Anion gap: 18 — ABNORMAL HIGH (ref 5–15)
BUN: 15 mg/dL (ref 6–20)
CO2: 23 mmol/L (ref 22–32)
Calcium: 9 mg/dL (ref 8.9–10.3)
Chloride: 91 mmol/L — ABNORMAL LOW (ref 98–111)
Creatinine, Ser: 1.35 mg/dL — ABNORMAL HIGH (ref 0.61–1.24)
GFR, Estimated: >60 mL/min
Glucose, Bld: 324 mg/dL — ABNORMAL HIGH (ref 70–99)
Potassium: 3.4 mmol/L — ABNORMAL LOW (ref 3.5–5.1)
Sodium: 131 mmol/L — ABNORMAL LOW (ref 135–145)

## 2024-08-08 LAB — CBG MONITORING, ED
Glucose-Capillary: 310 mg/dL — ABNORMAL HIGH (ref 70–99)
Glucose-Capillary: 302 mg/dL — ABNORMAL HIGH (ref 70–99)

## 2024-08-08 LAB — TROPONIN T, HIGH SENSITIVITY: Troponin T High Sensitivity: <15 ng/L (ref 0–19)

## 2024-08-08 MED ORDER — GLIPIZIDE 10 MG PO TABS: 10.0000 mg | ORAL_TABLET | Freq: Every day | ORAL | 0 refills | Status: AC

## 2024-08-08 MED ORDER — INSULIN ASPART 100 UNIT/ML IJ SOLN
6.0000 [IU] | Freq: Once | INTRAMUSCULAR | Status: AC
  Administered 2024-08-08: 6 [IU] via INTRAVENOUS
  Filled 2024-08-08: qty 6

## 2024-08-08 MED ORDER — SODIUM CHLORIDE 0.9 % IV BOLUS
1000.0000 mL | Freq: Once | INTRAVENOUS | Status: AC
  Administered 2024-08-08: 1000 mL via INTRAVENOUS

## 2024-08-08 NOTE — ED Provider Notes (Signed)
 "  Emergency Department Provider Note   I have reviewed the triage vital signs and the nursing notes.   HISTORY  Chief Complaint Hyperglycemia   HPI Danny Gordon is a 34 y.o. male with known history of type 2 diabetes intermittently compliant with metformin presents to the emergency department with elevated blood sugars.  This has been an issue going on now for the last 2 months.  He has been better about taking his twice daily metformin since his ED visit on 12/25.  He decided to come in today because his blood sugars continue to run high despite med compliance and is now having fatigue with polyuria and polydipsia.  No vomiting or diarrhea.  No fevers.  He has not been able to see his primary care since he was seen in the emergency department on the 25th.  Driving into the emergency department he developed heart palpitations.  He describes a rhythmic pounding in his chest.  No severe pain or shortness of breath but felt like he could feel his heart beating strongly.  No near syncope.  Past Medical History:  Diagnosis Date   Diabetes mellitus without complication (HCC)    Exposure to TB    Seizures (HCC)     Review of Systems  Constitutional: No fever/chills Cardiovascular: Denies chest pain. Positive palpitations.  Respiratory: Denies shortness of breath. Gastrointestinal: No abdominal pain.  No nausea, no vomiting.  No diarrhea.   Genitourinary: Negative for dysuria. Positive polyuria.  Neurological: Negative for headaches.  ____________________________________________   PHYSICAL EXAM:  VITAL SIGNS: ED Triage Vitals [08/08/24 1918]  Encounter Vitals Group     BP (!) 144/90     Pulse Rate (!) 109     Resp 18     Temp 98.5 F (36.9 C)     Temp Source Oral     SpO2 96 %     Weight (!) 360 lb (163.3 kg)     Height 6' (1.829 m)   Constitutional: Alert and oriented. Well appearing and in no acute distress. Eyes: Conjunctivae are normal. Head: Atraumatic. Nose:  No congestion/rhinnorhea. Mouth/Throat: Mucous membranes are moist.   Neck: No stridor.   Cardiovascular: Tachycardia. Good peripheral circulation. Grossly normal heart sounds.   Respiratory: Normal respiratory effort.  No retractions. Lungs CTAB. Gastrointestinal: Soft and nontender. No distention.  Musculoskeletal: No gross deformities of extremities. Neurologic:  Normal speech and language.  Skin:  Skin is warm, dry and intact. No rash noted.  ____________________________________________   LABS (all labs ordered are listed, but only abnormal results are displayed)  Labs Reviewed  BASIC METABOLIC PANEL WITH GFR - Abnormal; Notable for the following components:      Result Value   Sodium 131 (*)    Potassium 3.4 (*)    Chloride 91 (*)    Glucose, Bld 324 (*)    Creatinine, Ser 1.35 (*)    Anion gap 18 (*)    All other components within normal limits  CBG MONITORING, ED - Abnormal; Notable for the following components:   Glucose-Capillary 310 (*)    All other components within normal limits  CBG MONITORING, ED - Abnormal; Notable for the following components:   Glucose-Capillary 302 (*)    All other components within normal limits  CBC  HEPATIC FUNCTION PANEL  LIPASE, BLOOD  TROPONIN T, HIGH SENSITIVITY   ____________________________________________  EKG   EKG Interpretation Date/Time:  Tuesday August 08 2024 19:23:07 EST Ventricular Rate:  106 PR Interval:  140  QRS Duration:  87 QT Interval:  337 QTC Calculation: 448 R Axis:   51  Text Interpretation: Sinus tachycardia Borderline T wave abnormalities Confirmed by Darra Chew [phone]) on 08/08/2024 7:27:28 PM       ____________________________________________   PROCEDURES  Procedure(s) performed:   Procedures  None  ____________________________________________   INITIAL IMPRESSION / ASSESSMENT AND PLAN / ED COURSE  Pertinent labs & imaging results that were available during my care of the patient  were reviewed by me and considered in my medical decision making (see chart for details).   This patient is Presenting for Evaluation of hyperglycemia, which does require a range of treatment options, and is a complaint that involves a high risk of morbidity and mortality.  The Differential Diagnoses include DKA, HHS, AKI, arrhythmia, etc.  Critical Interventions-    Medications  sodium chloride  0.9 % bolus 1,000 mL (0 mLs Intravenous Stopped 08/08/24 2118)  insulin  aspart (novoLOG ) injection 6 Units (6 Units Intravenous Given 08/08/24 2049)    Reassessment after intervention: HR improved.   I decided to review pertinent External Data, and in summary patient seen in the ED on 12/25. No med changes.    Clinical Laboratory Tests Ordered, included CBG 310. CO2 normal. LFTs normal. Troponin normal.   Radiologic Tests Ordered, included CXR. I independently interpreted the images and agree with radiology interpretation.   Cardiac Monitor Tracing which shows sinus tachycardia.    Social Determinants of Health Risk patient is a smoker.   Medical Decision Making: Summary:  Patient presents emergency department with hyperglycemia.  Began having heart palpitations and route to the ED. sinus tachycardia on monitor.  Will need screening blood work to rule out DKA.  Will start IV fluids.  Reevaluation with update and discussion with patient. CBG down-trending. Mild gap but no acidosis. Doubt true DKA. Plan for Glipizide  and close PCP follow up.   Considered admission but CBG managed in the ED with starting new med from the ED.   Patient's presentation is most consistent with acute presentation with potential threat to life or bodily function.   Disposition: discharge  ____________________________________________  FINAL CLINICAL IMPRESSION(S) / ED DIAGNOSES  Final diagnoses:  Hyperglycemia  Palpitations     NEW OUTPATIENT MEDICATIONS STARTED DURING THIS VISIT:  Discharge Medication  List as of 08/08/2024 10:06 PM     START taking these medications   Details  glipiZIDE  (GLUCOTROL ) 10 MG tablet Take 1 tablet (10 mg total) by mouth daily before breakfast., Starting Tue 08/08/2024, Until Thu 09/07/2024, Normal        Note:  This document was prepared using Dragon voice recognition software and may include unintentional dictation errors.  Chew Darra, MD, [facility]    Capri Raben, Chew MATSU, MD 08/14/24 0730  "

## 2024-08-08 NOTE — Discharge Instructions (Signed)
 I am starting a second diabetes medication. Please call your PCP tomorrow morning for an ASAP follow up.

## 2024-08-08 NOTE — ED Triage Notes (Signed)
 Pt reports his blood sugar was elevated at 380 today and has been elevated over the last month. Hx of type 2 diabetes.  He also reports on the way here to the ER he began to have chest pain and feel heart palpitations.

## 2024-08-09 NOTE — Progress Notes (Signed)
 [address] - AMBULATORY [facility]  - [facility]NAL MEDICINE [facility] [address] [place] [id number]    Name: Danny Gordon. DOB: 1990-09-30   August 09, 2024  Patient ID: Danny Gordon. is a 34 y.o. male is here for ED Follow Up   HPI: This is my 1st encounter with Danny Gordon., who is a 34 y.o. African American male with a past medical history of IBS, GERD, type 2 DM, OSA, HTN, obesity, GAD, depression. Patient follows with Danny Comer Corona, PA-C as PCP.   Patient presents due to uncontrolled diabetes.  He has been taking 1000 mg of metformin in the evening instead of BID.  He just started back taking metformin on 07/13/2024.  He has not been feeling well.  He feels like he is drunk.  He went to ER last night. Glucose was 324 He was treated with IV insulin  and fluids.  He was prescribed Ozempic- but it was too expensive.   Patient is checking blood sugars regularly. Fasting sugars are 290-400.  Patient's sugar intake is mild Patient's carb intake is moderate Patient is eating a regular diet and not exercising regularly- active job Patient denies any lightheadedness, hypoglycemic episodes, heat/cold intolerance, frequent thirst, or excessive urination.  Last A1C: 7.1  He also is complaining of abnormal smell in his head. The smell is like a musty breath smell.  He is brushing teeth regularly.  He is compliant with teeth cleaning.  Started with abnormal smell 3 weeks ago.   ROS   Constitutional symptoms: negative Eyes:  negative Ear, nose, throat:  mouth sores and foul smelling breath Cardiovascular:  negative Respiratory:  negative Gastrointestinal:  negative Genitourinary:  negative Skin:  negative Neurological:  negative Musculoskeletal:  negative Psychiatric:  negative Endocrine:  elevated glucose Hematological:  negative Allergic:  negative    Assessment/Plan:    Danny Gordon was seen today for ed follow up.  Diagnoses and all orders for this visit:  Mouth sore Mouth plaque-fungal versus bacterial, patient does have braces We will go ahead and treat with nystatin -since patient has been complaining of foul smell in his mouth If there is limited improvement-advised him to let me know Continue to work on diabetic control -     nystatin  (MYCOSTATIN ) 100,000 unit/mL suspension; Take 5 mL (500,000 Units total) by mouth 4 (four) times a day for 10 days.  Type 2 diabetes mellitus without complication, without long-term current use of insulin     (CMD) Last A1c in May was 7.1, most recent glucose reading was 324 in the hospital We will recheck A1c today Talked with patient about compliance of medication-and diet and lifestyle modifications Discussed care with PCP We will go ahead and start him on long-acting insulin -10 units nightly, start that as soon as he picks at medication Patient has side effects with metformin-we will go ahead and start him on Actos 15 mg Encouraged him to work on diet changes, will check fasting glucose daily, send readings in 2 weeks Follow-up with PCP in 4 weeks He will have insurance as of February 1-[employer] [place], [place] I placed referral to [employer] help him get coverage for Ozempic/Mounjaro -     Hemoglobin A1C With Estimated Average Glucose; Future -     Comprehensive Metabolic Panel; Future -     pioglitazone (ACTOS) 15 mg tablet; Take 1 tablet (15 mg total) by mouth daily.  Benign essential HTN Blood pressure  stable-continue to work on limiting salt Keep follow-up in 4 weeks  Nausea Patient has nausea-most likely related to elevated glucose, will send in Zofran  for help -     ondansetron  (ZOFRAN ) 4 mg tablet; Take 1 tablet (4 mg total) by mouth every 8 (eight) hours as needed for nausea or vomiting.  Patient verbalizes understanding and in agreement with the above plan. All questions answered.    Medication side effects discussed with patient. Advised patient to call clinic or return for visit if symptoms occur.  Goals of care discussed with patient including med compliance and adequate follow up.  Return in about 4 weeks (around 09/06/2024) for DM.   Physical Exam    Vital Signs  Vitals:   08/09/24 0906 08/09/24 0941  BP: 147/86 (!) 128/100  BP Location:  Left arm  Patient Position:  Sitting  Pulse: 102   SpO2: 96%   Weight: (!) 172 kg (378 lb 6.4 oz)   Height: 1.81 m (5' 11.25)      Physical Exam Vitals reviewed.  Constitutional:      General: He is awake.     Appearance: Normal appearance. He is well-developed and well-groomed. He is obese. He is not ill-appearing, toxic-appearing or diaphoretic.  HENT:     Head: Normocephalic.     Right Ear: Tympanic membrane, ear canal and external ear normal.     Left Ear: Tympanic membrane, ear canal and external ear normal.     Nose: Nose normal.     Mouth/Throat:     Lips: Pink.     Mouth: Oral lesions present.     Pharynx: Oropharynx is clear.      Comments: White patch in mouth noted above, tender to palpation Cardiovascular:     Rate and Rhythm: Normal rate and regular rhythm. No extrasystoles are present.    Pulses: Normal pulses.          Radial pulses are 2+ on the right side and 2+ on the left side.       Posterior tibial pulses are 2+ on the right side and 2+ on the left side.     Heart sounds: Normal heart sounds. No murmur heard. Pulmonary:     Effort: Pulmonary effort is normal. No tachypnea, accessory muscle usage or respiratory distress.     Breath sounds: Normal breath sounds and air entry. No decreased air movement.  Musculoskeletal:     Right lower leg: No edema.     Left lower leg: No edema.  Neurological:     Mental Status: He is alert and oriented to person, place, and time. Mental status is at baseline.     GCS: GCS eye subscore is 4. GCS verbal subscore is 5. GCS motor subscore is 6.      Gait: Gait is intact.  Psychiatric:        Attention and Perception: Attention and perception normal.        Mood and Affect: Mood and affect normal.        Speech: Speech normal.        Behavior: Behavior is cooperative.        Thought Content: Thought content normal.        Cognition and Memory: Cognition and memory normal.        Judgment: Judgment normal.       Medical History    Allergies Allergies[1]  Medications Current Medications[2]  Medical history Medical History[3]  Surgical History Surgical History[4]  Social History  Tobacco Use History[5]  Family History Family History[6]  I have reviewed and (if needed) updated the patient's problem list, medications, allergies, past medical and surgical history, social and family history.  Portions of this note were dictated using DRAGON voice recognition software. Please disregard any errors in transcription. This record has been created using Conservation officer, historic buildings. Errors have been sought and corrected, but may not always be located. Such creation errors do not reflect on the standard of medical care.  Electronically signed by: Loria Stephane Penton, NP 08/09/2024 9:16 AM        [1] No Known Allergies [2] Current Outpatient Medications  Medication Sig Dispense Refill   acetaminophen  (TYLENOL ) 325 mg tablet Take 650 mg by mouth every 6 (six) hours.     amLODIPine (NORVASC) 5 mg tablet Take 1 tablet (5 mg total) by mouth daily. 90 tablet 2   cholecalciferol (VITAMIN D3) 1,250 mcg (50,000 unit) capsule Take 1 each (50,000 Units total) by mouth every 7 days for 8 doses. Recheck Vitamin D  levels in 12 weeks. 8 each 0   diclofenac  sodium (Voltaren  Arthritis Pain) 1 % gel Apply 2 g topically as needed (Apply to the knee as needed for pain). 50 g 3   hydroCHLOROthiazide (HYDRODIURIL) 25 mg tablet Take 1 tablet (25 mg total) by mouth daily. 90 tablet 3   metFORMIN (GLUCOPHAGE-XR) 500 mg 24 hr tablet Take 2  tablets (1,000 mg total) by mouth 2 (two) times a day. 360 tablet 3   insulin  glargine (LANTUS SoloStar) 100 unit/mL (3 mL) pen Inject 10 Units under the skin daily. 15 mL 2   semaglutide (Ozempic) 0.25 mg or 0.5 mg (2 mg/3 mL) pen injector Inject 0.25 mg under the skin once a week. (Patient not taking: Reported on 08/09/2024) 3 mL 0   No current facility-administered medications for this visit.  [3] Past Medical History: Diagnosis Date   Acid reflux    Allergy   [4] Past Surgical History: Procedure Laterality Date   VENTRAL HERNIA REPAIR N/A    Procedure: VENTRAL HERNIA REPAIR  [5] Social History Tobacco Use  Smoking Status Some Days   Current packs/day: 1.00   Average packs/day: 1 pack/day for 25.0 years (25.0 ttl pk-yrs)   Types: Cigarettes   Start date: 07/28/1999  Smokeless Tobacco Never  Tobacco Comments   Has quit for the most part  [6] Family History Problem Relation Name Age of Onset   Obesity Mother     Thyroid disease Mother     Diabetes Father     Obesity Father     Sleep apnea Father     Clotting disorder Father     Diabetes Maternal Grandmother     Obesity Maternal Grandmother     Diabetes Paternal Grandfather
